# Patient Record
Sex: Female | Born: 2000
Health system: Southern US, Community
[De-identification: ages and names within clinical notes are randomized; demographics above are authoritative.]

## PROBLEM LIST (undated history)

## (undated) DIAGNOSIS — T783XXA Angioneurotic edema, initial encounter: Secondary | ICD-10-CM

## (undated) DIAGNOSIS — L509 Urticaria, unspecified: Secondary | ICD-10-CM

## (undated) DIAGNOSIS — F419 Anxiety disorder, unspecified: Secondary | ICD-10-CM

## (undated) HISTORY — DX: Urticaria, unspecified: L50.9

## (undated) HISTORY — PX: TYMPANOSTOMY TUBE PLACEMENT: SHX32

## (undated) HISTORY — DX: Angioneurotic edema, initial encounter: T78.3XXA

## (undated) HISTORY — DX: Anxiety disorder, unspecified: F41.9

---
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
## 2022-01-06 ENCOUNTER — Other Ambulatory Visit (HOSPITAL_COMMUNITY): Payer: Self-pay | Admitting: Psychiatry

## 2022-01-06 ENCOUNTER — Telehealth (HOSPITAL_COMMUNITY): Payer: Self-pay

## 2022-01-06 MED ORDER — LISDEXAMFETAMINE DIMESYLATE 20 MG PO CAPS: 20.0000 mg | ORAL_CAPSULE | Freq: Every day | ORAL | 0 refills | Status: DC

## 2022-01-06 NOTE — Telephone Encounter (Signed)
Pt aware.

## 2022-01-06 NOTE — Telephone Encounter (Signed)
Pt called in to make appt for med refill, scheduled for tomorrow 01/07/22 pt only has enough medication for today. Pt is needing a refill on vyvanse 20 mg sent to [facility] on [address] in [place]. Please advise

## 2022-01-06 NOTE — Telephone Encounter (Signed)
sent 

## 2022-01-07 ENCOUNTER — Encounter (HOSPITAL_COMMUNITY): Payer: Self-pay | Admitting: Psychiatry

## 2022-01-07 ENCOUNTER — Telehealth (INDEPENDENT_AMBULATORY_CARE_PROVIDER_SITE_OTHER): Payer: 59 | Admitting: Psychiatry

## 2022-01-07 DIAGNOSIS — F900 Attention-deficit hyperactivity disorder, predominantly inattentive type: Secondary | ICD-10-CM | POA: Diagnosis not present

## 2022-01-07 DIAGNOSIS — F411 Generalized anxiety disorder: Secondary | ICD-10-CM | POA: Diagnosis not present

## 2022-01-07 MED ORDER — SERTRALINE HCL 100 MG PO TABS
100.0000 mg | ORAL_TABLET | Freq: Every day | ORAL | 2 refills | Status: DC
Start: 2022-01-07 — End: 2022-03-10

## 2022-01-07 MED ORDER — LISDEXAMFETAMINE DIMESYLATE 20 MG PO CAPS
20.0000 mg | ORAL_CAPSULE | Freq: Every day | ORAL | 0 refills | Status: DC
Start: 2022-01-07 — End: 2022-03-10

## 2022-01-07 MED ORDER — HYDROXYZINE HCL 10 MG PO TABS: ORAL_TABLET | ORAL | 2 refills | Status: DC

## 2022-01-07 NOTE — Progress Notes (Signed)
Virtual Visit via Video Note  I connected with Mary Johnson on 01/07/22 at 11:40 AM EDT by a video enabled telemedicine application and verified that I am speaking with the correct person using two identifiers.  Location: Patient: home Provider: office   I discussed the limitations of evaluation and management by telemedicine and the availability of in person appointments. The patient expressed understanding and agreed to proceed.     I discussed the assessment and treatment plan with the patient. The patient was provided an opportunity to ask questions and all were answered. The patient agreed with the plan and demonstrated an understanding of the instructions.   The patient was advised to call back or seek an in-person evaluation if the symptoms worsen or if the condition fails to improve as anticipated.  I provided 15 minutes of non-face-to-face time during this encounter.   Levonne Spiller, MD  [facility] MD/PA/NP OP Progress Note  01/07/2022 11:50 AM Mary Johnson  MRN:  [id number]  Chief Complaint:  Chief Complaint  Patient presents with   Anxiety   Depression   ADD   Follow-up   HPI: : This patient is a 21 year old white female who lives with her parents and an older brother in [place].  She is in adult GED program at [employer].  She is also working at an Psychologist, forensic at the school.  The patient returns for follow-up after 3 months.  For the most part she is doing okay.  She is going to probably finish her GED in 2 months.  She has had some conflicts with work as she got blamed for coworker leaving but this seems to have settled down now.  She states that overall she has been over thinking a lot and the second guessing whether or not people actually like her care about her.  For example she constantly has to check with her boyfriend to make sure that he is thinking of her.  I suggested that she get back into therapy to relearn how to manage this  better.  She denies significant depression.  She is able to leave the house and do everything comfortably.  She is sleeping well.  She is focusing well.  She denies a significant depression thoughts of self-harm or suicidal ideation. Visit Diagnosis:    ICD-10-CM   1. Generalized anxiety disorder  F41.1     2. Attention deficit hyperactivity disorder (ADHD), predominantly inattentive type  F90.0       Past Psychiatric History: Previous counseling  Past Medical History:  Past Medical History:  Diagnosis Date   Angio-edema    Anxiety    Concussion 09/2019   Urticaria     Past Surgical History:  Procedure Laterality Date   TYMPANOSTOMY TUBE PLACEMENT      Family Psychiatric History: See below  Family History:  Family History  Problem Relation Age of Onset   Allergic rhinitis Mother    Allergy (severe) Mother        alpha-gal   Allergic rhinitis Father    Allergy (severe) Father        alpha-gal   Asthma Brother        exercise induced, now outgrown   Anxiety disorder Maternal Grandmother     Social History:  Social History   Socioeconomic History   Marital status: Single    Spouse name: Not on file   Number of children: Not on file   Years of education: Not on file   Highest  education level: Not on file  Occupational History   Not on file  Tobacco Use   Smoking status: Never   Smokeless tobacco: Never  Vaping Use   Vaping Use: Never used  Substance and Sexual Activity   Alcohol use: No   Drug use: Never   Sexual activity: Never  Other Topics Concern   Not on file  Social History Narrative   Not on file   Social Determinants of Health   Financial Resource Strain: Not on file  Food Insecurity: Not on file  Transportation Needs: Not on file  Physical Activity: Not on file  Stress: Not on file  Social Connections: Not on file    Allergies:  Allergies  Allergen Reactions   Tamiflu [Oseltamivir Phosphate] Nausea And Vomiting   Alpha-Gal      Metabolic Disorder Labs: No results found for: "HGBA1C", "MPG" No results found for: "PROLACTIN" No results found for: "CHOL", "TRIG", "HDL", "CHOLHDL", "VLDL", "LDLCALC" No results found for: "TSH"  Therapeutic Level Labs: No results found for: "LITHIUM" No results found for: "VALPROATE" No results found for: "CBMZ"  Current Medications: Current Outpatient Medications  Medication Sig Dispense Refill   diphenhydrAMINE HCl (BENADRYL ALLERGY PO) Take by mouth.     EPINEPHrine (EPIPEN 2-PAK) 0.3 mg/0.3 mL IJ SOAJ injection Inject 0.3 mLs (0.3 mg total) into the muscle as needed for anaphylaxis. 2 each 0   hydrOXYzine (ATARAX) 10 MG tablet TAKE 2 TABLETS(20 MG) BY MOUTH TWICE DAILY 120 tablet 2   ibuprofen (ADVIL) 200 MG tablet Take by mouth. Takes 2 tabs as needed      lisdexamfetamine (VYVANSE) 20 MG capsule Take 1 capsule (20 mg total) by mouth daily. 30 capsule 0   ondansetron (ZOFRAN-ODT) 4 MG disintegrating tablet DISSOLVE 1 TABLET(4 MG) ON THE TONGUE EVERY 8 HOURS AS NEEDED FOR NAUSEA OR VOMITING 20 tablet 2   sertraline (ZOLOFT) 100 MG tablet Take 1 tablet (100 mg total) by mouth daily. 30 tablet 2   trimethoprim-polymyxin b (POLYTRIM) ophthalmic solution Place 1 drop into both eyes every 4 (four) hours. 10 mL 0   No current facility-administered medications for this visit.     Musculoskeletal: Strength & Muscle Tone: within normal limits Gait & Station: normal Patient leans: N/A  Psychiatric Specialty Exam: Review of Systems  Psychiatric/Behavioral:  The patient is nervous/anxious.   All other systems reviewed and are negative.   There were no vitals taken for this visit.There is no height or weight on file to calculate BMI.  General Appearance: Casual and Fairly Groomed  Eye Contact:  Good  Speech:  Clear and Coherent  Volume:  Normal  Mood:  Anxious and Euthymic  Affect:  Appropriate and Congruent  Thought Process:  Goal Directed  Orientation:  Full (Time,  Place, and Person)  Thought Content: Rumination   Suicidal Thoughts:  No  Homicidal Thoughts:  No  Memory:  Immediate;   Good Recent;   Good Remote;   Good  Judgement:  Good  Insight:  Fair  Psychomotor Activity:  Normal  Concentration:  Concentration: Good and Attention Span: Good  Recall:  Good  Fund of Knowledge: Good  Language: Good  Akathisia:  No  Handed:  Right  AIMS (if indicated): not done  Assets:  Communication Skills Desire for Improvement Physical Health Resilience Social Support Talents/Skills  ADL's:  Intact  Cognition: WNL  Sleep:  Good   Screenings: GAD-7    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 [place]  [facility] Office Visit from 11/18/2019 in [place]  Total GAD-7 Score 11 20      PHQ2-9    Flowsheet Row Video Visit from 01/07/2022 in [place] [facility] Video Visit from 10/16/2021 in [place] Video Visit from 08/09/2021 in [place] Video Visit from 03/07/2021 in [place] Video Visit from 12/24/2020 in [place] [facility]  PHQ-2 Total Score 0 2 0 0 3  PHQ-9 Total Score -- 5 -- -- 3      Flowsheet Row Video Visit from 01/07/2022 in [place] [facility] Video Visit from 08/09/2021 in [place] [facility] Video Visit from 03/07/2021 in [place] No Risk No Risk No Risk        Assessment and Plan: This patient is an 21 year old female with a 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  She is doing much better in terms of being able to go out and do everything she needs to do.  However she is still tends to overthink and get  anxious.  We will get her back in therapy to deal with this.  She will continue Zoloft 100 mg daily for anxiety as well as hydroxyzine 20 mg twice daily for anxiety and Vyvanse 20 mg daily for ADD.  She will return to see me in 2 months  Collaboration of Care: Collaboration of Care: Referral or follow-up with counselor/therapist AEB patient will be referred back to [facility] for therapy  Patient/Guardian was advised Release of Information must be obtained prior to any record release in order to collaborate their care with an outside provider. Patient/Guardian was advised if they have not already done so to contact the registration department to sign all necessary forms in order for [place] to release information regarding their care.   Consent: Patient/Guardian gives verbal consent for treatment and assignment of benefits for services provided during this visit. Patient/Guardian expressed understanding and agreed to proceed.    Levonne Spiller, MD 01/07/2022, 11:50 AM

## 2022-02-06 ENCOUNTER — Ambulatory Visit (HOSPITAL_COMMUNITY): Payer: 59 | Admitting: Psychiatry

## 2022-02-24 ENCOUNTER — Encounter: Payer: Self-pay | Admitting: Adult Health

## 2022-02-24 ENCOUNTER — Ambulatory Visit: Payer: 59 | Admitting: Adult Health

## 2022-02-24 VITALS — BP 122/85 | HR 77 | Ht 62.0 in | Wt 189.0 lb

## 2022-02-24 DIAGNOSIS — Z3202 Encounter for pregnancy test, result negative: Secondary | ICD-10-CM | POA: Diagnosis not present

## 2022-02-24 DIAGNOSIS — Z113 Encounter for screening for infections with a predominantly sexual mode of transmission: Secondary | ICD-10-CM | POA: Insufficient documentation

## 2022-02-24 DIAGNOSIS — Z30011 Encounter for initial prescription of contraceptive pills: Secondary | ICD-10-CM | POA: Insufficient documentation

## 2022-02-24 HISTORY — DX: Encounter for screening for infections with a predominantly sexual mode of transmission: Z11.3

## 2022-02-24 LAB — POCT URINE PREGNANCY: Preg Test, Ur: NEGATIVE

## 2022-02-24 MED ORDER — LO LOESTRIN FE 1 MG-10 MCG / 10 MCG PO TABS: 1.0000 | ORAL_TABLET | Freq: Every day | ORAL | 0 refills | Status: DC

## 2022-02-24 NOTE — Progress Notes (Signed)
Subjective:     Patient ID: Mary Johnson, female   DOB: [birth year], 21 y.o.   MRN: [id number]  HPI Mary Johnson is a 21 year old white female,single, G0P0, in to discuss getting on birth control. Does not want any weight gain.  PCP is Dr Lacinda Axon  Review of Systems Periods regular Last 3-4 days Heavy 1-2 days, may change pad every 3 hours Has some cramps and clots Has had sex, no pain Has mild acne    Reviewed past medical,surgical, social and family history. Reviewed medications and allergies.   Objective:   Physical Exam BP 122/85 (BP Location: Left Arm, Patient Position: Sitting, Cuff Size: Normal)   Pulse 77   Ht 5\' 2"  (1.575 m)   Wt 189 lb (85.7 kg)   LMP 02/23/2022 (Exact Date)   BMI 34.57 kg/m  UPT is negative. Skin warm and dry. Neck: mid line trachea, normal thyroid, good ROM, no lymphadenopathy noted. Lungs: clear to ausculation bilaterally. Cardiovascular: regular rate and rhythm.    AA is 0 Fall risk is low    02/24/2022   10:28 AM 01/07/2022   11:39 AM 10/16/2021    9:11 AM  Depression screen PHQ 2/9  Decreased Interest 0    Down, Depressed, Hopeless 0    PHQ - 2 Score 0    Altered sleeping 1    Tired, decreased energy 1    Change in appetite 2    Feeling bad or failure about yourself  1    Trouble concentrating 0    Moving slowly or fidgety/restless 0    Suicidal thoughts 0    PHQ-9 Score 5    Difficult doing work/chores        Information is confidential and restricted. Go to Review Flowsheets to unlock data.   On meds and sees Dr Harrington Challenger    02/24/2022   10:29 AM 05/15/2020    3:42 PM 11/18/2019    1:49 PM  GAD 7 : Generalized Anxiety Score  Nervous, Anxious, on Edge 1  3  Control/stop worrying 1  2  Worry too much - different things 1  3  Trouble relaxing 1  3  Restless 0  3  Easily annoyed or irritable 1  3  Afraid - awful might happen 0  3  Total GAD 7 Score 5  20  Anxiety Difficulty   Somewhat difficult     Information is confidential and  restricted. Go to Review Flowsheets to unlock data.    [facility] - 02/24/22 1014       Pregnancy Intention Screening   Does the patient want to become pregnant in the next year? No    Does the patient's partner want to become pregnant in the next year? No    Would the patient like to discuss contraceptive options today? Yes      Contraception Wrap Up   Current Method No Method - Other Reason    End Method Oral Contraceptive;Female Condom    Contraception Counseling Provided Yes    How was the end contraceptive method provided? Provided on site               Assessment:     1. Screening examination for STD (sexually transmitted disease) Urine sent for GC/CHL  2. Pregnancy examination or test, negative result  3. Encounter for initial prescription of contraceptive pills Denies any MI,stroke,DVT, breast cancer or migraine with aura Will try lo Loestrin 4 packs given, can start today and use condoms  for 1 pack      Meds ordered this encounter  Medications   Norethindrone-Ethinyl Estradiol-Fe Biphas (LO LOESTRIN FE) 1 MG-10 MCG / 10 MCG tablet    Sig: Take 1 tablet by mouth daily. Take 1 daily by mouth    Dispense:  112 tablet    Refill:  0    BIN [id number], PCN [id number], GRP [id number]    Order Specific Question:   Supervising Provider    Answer:   Lazaro Arms [[id number]]    Plan:     Follow up in 3 months for ROS or sooner if needed

## 2022-02-26 LAB — GC/CHLAMYDIA PROBE AMP
Chlamydia trachomatis, NAA: NEGATIVE
Neisseria Gonorrhoeae by PCR: NEGATIVE

## 2022-03-04 ENCOUNTER — Encounter: Payer: Self-pay | Admitting: Family Medicine

## 2022-03-04 ENCOUNTER — Ambulatory Visit (INDEPENDENT_AMBULATORY_CARE_PROVIDER_SITE_OTHER): Payer: 59 | Admitting: Family Medicine

## 2022-03-04 VITALS — BP 128/80 | HR 79 | Temp 98.3°F | Wt 189.2 lb

## 2022-03-04 DIAGNOSIS — J209 Acute bronchitis, unspecified: Secondary | ICD-10-CM | POA: Diagnosis not present

## 2022-03-04 HISTORY — DX: Acute bronchitis, unspecified: J20.9

## 2022-03-04 MED ORDER — BENZONATATE 200 MG PO CAPS: 200.0000 mg | ORAL_CAPSULE | Freq: Three times a day (TID) | ORAL | 0 refills | Status: DC | PRN

## 2022-03-04 MED ORDER — AZITHROMYCIN 250 MG PO TABS: ORAL_TABLET | ORAL | 0 refills | Status: AC

## 2022-03-04 NOTE — Assessment & Plan Note (Signed)
Treating with azithromycin and Tessalon Perles. 

## 2022-03-04 NOTE — Progress Notes (Signed)
Subjective:  Patient ID: Mary Johnson, female    DOB: Dec 14, 2000  Age: 21 y.o. MRN: [id number]  CC: Chief Complaint  Patient presents with   Cough    Pt arrives for congested cough and runny nose. Pt states she has had sinus issues for about 2-3 weeks.     HPI:  21 year old female presents for evaluation of the above.  Patient reports that she has been sick for the past 2 to 3 weeks.  She reports ongoing cough.  She states that she has a "congested cough".  It has not improved with over-the-counter treatment.  Has not improved with time either.  No fever.  No shortness of breath.  She has had some runny nose.  Some ear fullness.  She is most troubled by the cough.  No reported sick contacts.  No other complaints.  Patient Active Problem List   Diagnosis Date Noted   Acute bronchitis 03/04/2022   ADD (attention deficit disorder) 01/21/2013    Social Hx   Social History   Socioeconomic History   Marital status: Single    Spouse name: Not on file   Number of children: Not on file   Years of education: Not on file   Highest education level: Not on file  Occupational History   Not on file  Tobacco Use   Smoking status: Never   Smokeless tobacco: Never  Vaping Use   Vaping Use: Never used  Substance and Sexual Activity   Alcohol use: No   Drug use: Never   Sexual activity: Yes    Birth control/protection: None  Other Topics Concern   Not on file  Social History Narrative   Not on file   Social Determinants of Health   Financial Resource Strain: Medium Risk (02/24/2022)   Overall Financial Resource Strain (CARDIA)    Difficulty of Paying Living Expenses: Somewhat hard  Food Insecurity: No Food Insecurity (02/24/2022)   Hunger Vital Sign    Worried About Running Out of Food in the Last Year: Never true    Ran Out of Food in the Last Year: Never true  Transportation Needs: No Transportation Needs (02/24/2022)   PRAPARE - Administrator, Civil Service  (Medical): No    Lack of Transportation (Non-Medical): No  Physical Activity: Insufficiently Active (02/24/2022)   Exercise Vital Sign    Days of Exercise per Week: 1 day    Minutes of Exercise per Session: 20 min  Stress: Stress Concern Present (02/24/2022)   [employer] of Occupational Health - Occupational Stress Questionnaire    Feeling of Stress : To some extent  Social Connections: Moderately Isolated (02/24/2022)   Social Connection and Isolation Panel [NHANES]    Frequency of Communication with Friends and Family: More than three times a week    Frequency of Social Gatherings with Friends and Family: Three times a week    Attends Religious Services: 1 to 4 times per year    Active Member of Clubs or Organizations: No    Attends Banker Meetings: Never    Marital Status: Never married    Review of Systems Per HPI  Objective:  BP 128/80   Pulse 79   Temp 98.3 F (36.8 C)   Wt 189 lb 3.2 oz (85.8 kg)   LMP 02/23/2022 (Exact Date)   SpO2 97%   BMI 34.61 kg/m      03/04/2022    3:56 PM 02/24/2022   10:10 AM 07/23/2021  3:51 PM  BP/Weight  Systolic BP 128 122 130  Diastolic BP 80 85 83  Wt. (Lbs) 189.2 189 166  BMI 34.61 kg/m2 34.57 kg/m2 30.86 kg/m2    Physical Exam Vitals and nursing note reviewed.  Constitutional:      General: She is not in acute distress.    Appearance: Normal appearance.  HENT:     Head: Normocephalic and atraumatic.  Eyes:     General:        Right eye: No discharge.        Left eye: No discharge.     Conjunctiva/sclera: Conjunctivae normal.  Cardiovascular:     Rate and Rhythm: Normal rate and regular rhythm.  Pulmonary:     Effort: Pulmonary effort is normal.     Breath sounds: Wheezing present.  Neurological:     Mental Status: She is alert.  Psychiatric:        Mood and Affect: Mood normal.        Behavior: Behavior normal.    Assessment & Plan:   Problem List Items Addressed This Visit        Respiratory   Acute bronchitis - Primary    Treating with azithromycin and Tessalon Perles.       Meds ordered this encounter  Medications   azithromycin (ZITHROMAX) 250 MG tablet    Sig: Take 2 tablets on day 1, then 1 tablet daily on days 2 through 5    Dispense:  6 tablet    Refill:  0   benzonatate (TESSALON) 200 MG capsule    Sig: Take 1 capsule (200 mg total) by mouth 3 (three) times daily as needed for cough.    Dispense:  30 capsule    Refill:  0    Follow-up:  Return if symptoms worsen or fail to improve.  Everlene Other DO [facility]

## 2022-03-10 ENCOUNTER — Telehealth (HOSPITAL_COMMUNITY): Payer: 59 | Admitting: Psychiatry

## 2022-03-10 ENCOUNTER — Encounter (HOSPITAL_COMMUNITY): Payer: Self-pay | Admitting: Psychiatry

## 2022-03-10 DIAGNOSIS — F900 Attention-deficit hyperactivity disorder, predominantly inattentive type: Secondary | ICD-10-CM | POA: Diagnosis not present

## 2022-03-10 DIAGNOSIS — F411 Generalized anxiety disorder: Secondary | ICD-10-CM | POA: Diagnosis not present

## 2022-03-10 MED ORDER — HYDROXYZINE HCL 10 MG PO TABS
20.0000 mg | ORAL_TABLET | Freq: Two times a day (BID) | ORAL | 2 refills | Status: DC
  Filled 2022-06-13: qty 120, 30d supply, fill #0

## 2022-03-10 MED ORDER — SERTRALINE HCL 100 MG PO TABS
100.0000 mg | ORAL_TABLET | Freq: Every day | ORAL | 2 refills | Status: DC
  Filled 2022-06-13: qty 30, 30d supply, fill #0

## 2022-03-10 MED ORDER — LISDEXAMFETAMINE DIMESYLATE 30 MG PO CAPS: 30.0000 mg | ORAL_CAPSULE | ORAL | 0 refills | Status: DC

## 2022-03-10 NOTE — Progress Notes (Signed)
BH MD/PA/NP OP Progress Note  03/10/2022 9:44 AM Mary Johnson  MRN:  [id number]  Chief Complaint:  Chief Complaint  Patient presents with   Anxiety   ADHD   Follow-up   HPI: : This patient is a 21 year old white female who lives with her parents and an older brother in [place].  She is in adult GED program at [employer].  She is also working at an Psychologist, forensic at the school.   The patient returns for follow-up after 2 months.  She states that things at work have been difficult.  She states that other people are saying negative things to the boss about her.  She is having a meeting with the bus today to try to clear this up.  She also finds that she is having a lot more trouble focusing on her GED class work and not getting as much done.  She would like to go up a bit on the Vyvanse and I think this is reasonable since her dosage is still low.  In terms of mood she is doing well and she is much less anxious and is able to leave the house and do everything she would like to do.  She is sleeping well.  She denies significant depression thoughts of self-harm or suicide Visit Diagnosis:    ICD-10-CM   1. Generalized anxiety disorder  F41.1     2. Attention deficit hyperactivity disorder (ADHD), predominantly inattentive type  F90.0       Past Psychiatric History: Previous counseling  Past Medical History:  Past Medical History:  Diagnosis Date   Angio-edema    Anxiety    Concussion 09/2019   Screening examination for STD (sexually transmitted disease) 02/24/2022   Urticaria     Past Surgical History:  Procedure Laterality Date   TYMPANOSTOMY TUBE PLACEMENT      Family Psychiatric History: See below  Family History:  Family History  Problem Relation Age of Onset   Cancer Paternal Grandfather        prostate   Kidney failure Paternal Grandfather    Dementia Paternal Grandmother    Breast cancer Maternal Grandmother    Anxiety disorder Maternal  Grandmother    Depression Maternal Grandmother    Cancer Maternal Grandfather        prostate   Allergic rhinitis Father    Allergy (severe) Father        alpha-gal   Allergic rhinitis Mother    Allergy (severe) Mother        alpha-gal   Asthma Brother        exercise induced, now outgrown    Social History:  Social History   Socioeconomic History   Marital status: Single    Spouse name: Not on file   Number of children: Not on file   Years of education: Not on file   Highest education level: Not on file  Occupational History   Not on file  Tobacco Use   Smoking status: Never   Smokeless tobacco: Never  Vaping Use   Vaping Use: Never used  Substance and Sexual Activity   Alcohol use: No   Drug use: Never   Sexual activity: Yes    Birth control/protection: None  Other Topics Concern   Not on file  Social History Narrative   Not on file   Social Determinants of Health   Financial Resource Strain: Medium Risk (02/24/2022)   Overall Financial Resource Strain (CARDIA)    Difficulty of  Paying Living Expenses: Somewhat hard  Food Insecurity: No Food Insecurity (02/24/2022)   Hunger Vital Sign    Worried About Running Out of Food in the Last Year: Never true    Ran Out of Food in the Last Year: Never true  Transportation Needs: No Transportation Needs (02/24/2022)   PRAPARE - Hydrologist (Medical): No    Lack of Transportation (Non-Medical): No  Physical Activity: Insufficiently Active (02/24/2022)   Exercise Vital Sign    Days of Exercise per Week: 1 day    Minutes of Exercise per Session: 20 min  Stress: Stress Concern Present (02/24/2022)   [place]    Feeling of Stress : To some extent  Social Connections: Moderately Isolated (02/24/2022)   Social Connection and Isolation Panel [NHANES]    Frequency of Communication with Friends and Family: More than three times a  week    Frequency of Social Gatherings with Friends and Family: Three times a week    Attends Religious Services: 1 to 4 times per year    Active Member of Clubs or Organizations: No    Attends Archivist Meetings: Never    Marital Status: Never married    Allergies:  Allergies  Allergen Reactions   Tamiflu [Oseltamivir Phosphate] Nausea And Vomiting   Alpha-Gal     Metabolic Disorder Labs: No results found for: "HGBA1C", "MPG" No results found for: "PROLACTIN" No results found for: "CHOL", "TRIG", "HDL", "CHOLHDL", "VLDL", "LDLCALC" No results found for: "TSH"  Therapeutic Level Labs: No results found for: "LITHIUM" No results found for: "VALPROATE" No results found for: "CBMZ"  Current Medications: Current Outpatient Medications  Medication Sig Dispense Refill   lisdexamfetamine (VYVANSE) 30 MG capsule Take 1 capsule (30 mg total) by mouth every morning. 30 capsule 0   benzonatate (TESSALON) 200 MG capsule Take 1 capsule (200 mg total) by mouth 3 (three) times daily as needed for cough. 30 capsule 0   Calcium Carbonate Antacid (TUMS PO) Take by mouth.     diphenhydrAMINE HCl (BENADRYL ALLERGY PO) Take by mouth.     EPINEPHrine (EPIPEN 2-PAK) 0.3 mg/0.3 mL IJ SOAJ injection Inject 0.3 mLs (0.3 mg total) into the muscle as needed for anaphylaxis. 2 each 0   hydrOXYzine (ATARAX) 10 MG tablet TAKE 2 TABLETS(20 MG) BY MOUTH TWICE DAILY 120 tablet 2   ibuprofen (ADVIL) 200 MG tablet Take by mouth. Takes 2 tabs as needed      Norethindrone-Ethinyl Estradiol-Fe Biphas (LO LOESTRIN FE) 1 MG-10 MCG / 10 MCG tablet Take 1 tablet by mouth daily. Take 1 daily by mouth 112 tablet 0   sertraline (ZOLOFT) 100 MG tablet Take 1 tablet (100 mg total) by mouth daily. 30 tablet 2   No current facility-administered medications for this visit.     Musculoskeletal: Strength & Muscle Tone: na Gait & Station: na Patient leans: N/A  Psychiatric Specialty Exam: Review of Systems   Psychiatric/Behavioral:  Positive for decreased concentration.   All other systems reviewed and are negative.   Last menstrual period 02/23/2022.There is no height or weight on file to calculate BMI.  General Appearance: NA  Eye Contact:  NA  Speech:  Clear and Coherent  Volume:  Normal  Mood:  Euthymic  Affect:  NA  Thought Process:  Goal Directed  Orientation:  Full (Time, Place, and Person)  Thought Content: Rumination   Suicidal Thoughts:  No  Homicidal Thoughts:  No  Memory:  Immediate;   Good Recent;   Fair Remote;   Fair  Judgement:  Good  Insight:  Fair  Psychomotor Activity:  Normal  Concentration:  Concentration: Fair and Attention Span: Fair  Recall:  Good  Fund of Knowledge: Good  Language: Good  Akathisia:  No  Handed:  Right  AIMS (if indicated): not done  Assets:  Communication Skills Desire for Improvement Physical Health Resilience Social Support Talents/Skills  ADL's:  Intact  Cognition: WNL  Sleep:  Good   Screenings: GAD-7    Flowsheet Row Office Visit from 02/24/2022 in [facility] Counselor from [facility] Office Visit from 11/18/2019 in [facility]  Total GAD-7 Score 5 11 20       PHQ2-9    Flowsheet Row Office Visit from 02/24/2022 in [facility] Video Visit from 01/07/2022 in [facility] Video Visit from 10/16/2021 in [facility] Video Visit from 08/09/2021 in [facility] Video Visit from 03/07/2021 in [facility]  PHQ-2 Total Score 0 0 2 0 0  PHQ-9 Total Score 5 -- 5 -- --      Flowsheet Row Video Visit from 01/07/2022 in [facility] Video Visit from 08/09/2021 in [facility] Video Visit from  03/07/2021 in [facility]  C-SSRS RISK CATEGORY No Risk No Risk No Risk        Assessment and Plan: This patient is an 21 year old female with a 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  She is doing well although she is having more trouble with focus so we will increase Vyvanse to 30 mg daily for ADD.  She will continue Zoloft 100 mg daily for anxiety as well as hydroxyzine 20 mg twice daily also for anxiety.  Collaboration of Care: Collaboration of Care: Primary Care Provider AEB notes will be shared with PCP at patient's request  Patient/Guardian was advised Release of Information must be obtained prior to any record release in order to collaborate their care with an outside provider. Patient/Guardian was advised if they have not already done so to contact the registration department to sign all necessary forms in order for 15 to release information regarding their care.   Consent: Patient/Guardian gives verbal consent for treatment and assignment of benefits for services provided during this visit. Patient/Guardian expressed understanding and agreed to proceed.    [place], MD 03/10/2022, 9:44 AM

## 2022-04-14 ENCOUNTER — Telehealth (HOSPITAL_COMMUNITY): Payer: 59 | Admitting: Psychiatry

## 2022-05-23 ENCOUNTER — Telehealth (HOSPITAL_COMMUNITY): Payer: Self-pay | Admitting: *Deleted

## 2022-05-23 NOTE — Telephone Encounter (Signed)
Patient called and [facility] stating she is needing refills for her Vyvanse sent to the pharmacy. Per pt she is out of refill and capsules

## 2022-05-26 ENCOUNTER — Other Ambulatory Visit (HOSPITAL_COMMUNITY): Payer: Self-pay | Admitting: Psychiatry

## 2022-05-26 MED ORDER — LISDEXAMFETAMINE DIMESYLATE 30 MG PO CAPS: 30.0000 mg | ORAL_CAPSULE | ORAL | 0 refills | Status: DC

## 2022-05-26 NOTE — Telephone Encounter (Signed)
Called pt appt scheduled 06/06/22 please send refill to [address]

## 2022-05-26 NOTE — Telephone Encounter (Signed)
Call for appt

## 2022-05-27 ENCOUNTER — Ambulatory Visit: Payer: 59 | Admitting: Adult Health

## 2022-05-27 NOTE — Telephone Encounter (Signed)
Called pt no answer left vm to check pharmacy

## 2022-06-06 ENCOUNTER — Telehealth (INDEPENDENT_AMBULATORY_CARE_PROVIDER_SITE_OTHER): Payer: 59 | Admitting: Psychiatry

## 2022-06-06 DIAGNOSIS — Z91199 Patient's noncompliance with other medical treatment and regimen due to unspecified reason: Secondary | ICD-10-CM

## 2022-06-09 NOTE — Progress Notes (Signed)
No show

## 2022-06-10 ENCOUNTER — Ambulatory Visit: Payer: 59 | Admitting: Adult Health

## 2022-06-10 ENCOUNTER — Encounter: Payer: Self-pay | Admitting: Adult Health

## 2022-06-10 VITALS — BP 131/74 | HR 88 | Ht 62.0 in | Wt 191.5 lb

## 2022-06-10 DIAGNOSIS — Z3041 Encounter for surveillance of contraceptive pills: Secondary | ICD-10-CM | POA: Diagnosis not present

## 2022-06-10 HISTORY — DX: Encounter for surveillance of contraceptive pills: Z30.41

## 2022-06-10 MED ORDER — LO LOESTRIN FE 1 MG-10 MCG / 10 MCG PO TABS
1.0000 | ORAL_TABLET | Freq: Every day | ORAL | 4 refills | Status: DC
  Filled 2022-06-13 – 2022-07-08 (×2): qty 84, 84d supply, fill #0
  Filled 2022-10-01: qty 84, 84d supply, fill #1
  Filled 2023-01-14: qty 84, 84d supply, fill #2

## 2022-06-10 NOTE — Progress Notes (Signed)
  Subjective:     Patient ID: Mary Johnson, female   DOB: [birth year], 22 y.o.   MRN: [id number]  HPI Mary Johnson is a 22 year old white female,single, G0P0, back in follow up on starting lo Loestrin and periods good, lighter. She and boyfriend recently broke up. 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  Review of Systems Periods good Reviewed past medical,surgical, social and family history. Reviewed medications and allergies.     Objective:   Physical Exam BP 131/74 (BP Location: Left Arm, Patient Position: Sitting, Cuff Size: Normal)   Pulse 88   Ht 5\' 2"  (1.575 m)   Wt 191 lb 8 oz (86.9 kg)   LMP 05/16/2022   BMI 35.03 kg/m  Skin warm and dry.  Lungs: clear to ausculation bilaterally. Cardiovascular: regular rate and rhythm.  Fall risk is low     Upstream - 06/10/22 1108       Pregnancy Intention Screening   Does the patient want to become pregnant in the next year? No    Does the patient's partner want to become pregnant in the next year? No    Would the patient like to discuss contraceptive options today? No      Contraception Wrap Up   Current Method Oral Contraceptive    End Method Oral Contraceptive             Assessment:     1. Encounter for surveillance of contraceptive pills Happy with lo Loestrin Will send in rx for lo Loestrin Meds ordered this encounter  Medications   Norethindrone-Ethinyl Estradiol-Fe Biphas (LO LOESTRIN FE) 1 MG-10 MCG / 10 MCG tablet    Sig: Take 1 tablet by mouth daily. Take 1 daily by mouth    Dispense:  84 tablet    Refill:  4    BIN [id number], PCN [id number], GRP [id number]    Order Specific Question:   Supervising Provider    Answer:   Florian Buff [[id number]]       Plan:     Follow up in 1 year or sooner if needed

## 2022-06-13 ENCOUNTER — Other Ambulatory Visit (HOSPITAL_COMMUNITY): Payer: Self-pay

## 2022-07-04 ENCOUNTER — Telehealth (HOSPITAL_COMMUNITY): Payer: Self-pay | Admitting: *Deleted

## 2022-07-04 ENCOUNTER — Other Ambulatory Visit (HOSPITAL_COMMUNITY): Payer: Self-pay

## 2022-07-04 NOTE — Telephone Encounter (Signed)
Patient called stating she is out of hr Medication. Per pt she would like to see if provider could please send in enough to last her until Monday due to being out for a few days now. Patient follow up appt with provider is Monday 07-07-2022. Patient medication was last filled on 05/26/2022.   Medication that's being requesting is her Vyvanse.   She would like it to be sent to [facility] pharmacy in [place].

## 2022-07-07 ENCOUNTER — Telehealth (INDEPENDENT_AMBULATORY_CARE_PROVIDER_SITE_OTHER): Payer: Self-pay | Admitting: Psychiatry

## 2022-07-07 ENCOUNTER — Other Ambulatory Visit (HOSPITAL_COMMUNITY): Payer: Self-pay

## 2022-07-07 ENCOUNTER — Encounter (HOSPITAL_COMMUNITY): Payer: Self-pay | Admitting: Psychiatry

## 2022-07-07 DIAGNOSIS — F411 Generalized anxiety disorder: Secondary | ICD-10-CM

## 2022-07-07 DIAGNOSIS — F900 Attention-deficit hyperactivity disorder, predominantly inattentive type: Secondary | ICD-10-CM

## 2022-07-07 MED ORDER — HYDROXYZINE HCL 10 MG PO TABS
20.0000 mg | ORAL_TABLET | Freq: Two times a day (BID) | ORAL | 2 refills | Status: DC
  Filled 2022-07-07: qty 120, 30d supply, fill #0
  Filled 2022-08-18: qty 120, 30d supply, fill #1
  Filled 2022-10-13: qty 120, 30d supply, fill #2

## 2022-07-07 MED ORDER — SERTRALINE HCL 100 MG PO TABS
100.0000 mg | ORAL_TABLET | Freq: Every day | ORAL | 2 refills | Status: DC
  Filled 2022-07-07: qty 30, 30d supply, fill #0
  Filled 2022-08-18: qty 30, 30d supply, fill #1
  Filled 2022-10-13: qty 30, 30d supply, fill #2

## 2022-07-07 MED ORDER — LISDEXAMFETAMINE DIMESYLATE 30 MG PO CAPS
30.0000 mg | ORAL_CAPSULE | ORAL | 0 refills | Status: DC
  Filled 2022-07-07 – 2022-09-10 (×3): qty 30, 30d supply, fill #0

## 2022-07-07 MED ORDER — LISDEXAMFETAMINE DIMESYLATE 30 MG PO CAPS
30.0000 mg | ORAL_CAPSULE | ORAL | 0 refills | Status: DC
  Filled 2022-07-07 – 2022-11-06 (×2): qty 30, 30d supply, fill #0

## 2022-07-07 MED ORDER — LISDEXAMFETAMINE DIMESYLATE 30 MG PO CAPS
30.0000 mg | ORAL_CAPSULE | ORAL | 0 refills | Status: DC
  Filled 2022-07-07: qty 30, 30d supply, fill #0

## 2022-07-07 NOTE — Progress Notes (Signed)
Virtual Visit via Video Note  I connected with Mary Johnson on 07/07/22 at 10:40 AM EDT by a video enabled telemedicine application and verified that I am speaking with the correct person using two identifiers.  Location: Patient: home Provider: office   I discussed the limitations of evaluation and management by telemedicine and the availability of in person appointments. The patient expressed understanding and agreed to proceed.     I discussed the assessment and treatment plan with the patient. The patient was provided an opportunity to ask questions and all were answered. The patient agreed with the plan and demonstrated an understanding of the instructions.   The patient was advised to call back or seek an in-person evaluation if the symptoms worsen or if the condition fails to improve as anticipated.  I provided 15 minutes of non-face-to-face time during this encounter.   Diannia Rudereborah Sacha Radloff, MD  [facility] MD/PA/NP OP Progress Note  07/07/2022 10:55 AM Mary HollingsheadJamison Rae Mangino  MRN:  [id number]  Chief Complaint:  Chief Complaint  Patient presents with   Anxiety   Depression   ADD   Follow-up   HPI: This patient is a 22 year old white female who lives with her parents and an older brother in [place].  She is in adult GED program at [employer].  She is also working at an Environmental managerafterschool program at the school.   The patient returns for follow-up after 2 months regarding her anxiety and ADD.  For the most part she is doing well.  She is almost done with her GED and plans to continue in early childhood development classes.  She is still working at [employer] program and now enjoying it a lot more.  She states her mood has been good and she denies significant depression anxiety difficulty sleeping thoughts of self-harm or suicide.  She denies significant anxiety.  She is focusing well on the higher dosage of Vyvanse. Visit Diagnosis:    ICD-10-CM   1. Generalized anxiety disorder   F41.1     2. Attention deficit hyperactivity disorder (ADHD), predominantly inattentive type  F90.0       Past Psychiatric History: Previous counseling  Past Medical History:  Past Medical History:  Diagnosis Date   Angio-edema    Anxiety    Concussion 09/2019   Screening examination for STD (sexually transmitted disease) 02/24/2022   Urticaria     Past Surgical History:  Procedure Laterality Date   TYMPANOSTOMY TUBE PLACEMENT      Family Psychiatric History: See below  Family History:  Family History  Problem Relation Age of Onset   Cancer Paternal Grandfather        prostate   Kidney failure Paternal Grandfather    Dementia Paternal Grandmother    Breast cancer Maternal Grandmother    Anxiety disorder Maternal Grandmother    Depression Maternal Grandmother    Cancer Maternal Grandfather        prostate   Allergic rhinitis Father    Allergy (severe) Father        alpha-gal   Allergic rhinitis Mother    Allergy (severe) Mother        alpha-gal   Asthma Brother        exercise induced, now outgrown    Social History:  Social History   Socioeconomic History   Marital status: Single    Spouse name: Not on file   Number of children: Not on file   Years of education: Not on file  Highest education level: Not on file  Occupational History   Not on file  Tobacco Use   Smoking status: Never   Smokeless tobacco: Never  Vaping Use   Vaping Use: Never used  Substance and Sexual Activity   Alcohol use: No   Drug use: Never   Sexual activity: Yes    Birth control/protection: Pill  Other Topics Concern   Not on file  Social History Narrative   Not on file   Social Determinants of Health   Financial Resource Strain: Medium Risk (02/24/2022)   Overall Financial Resource Strain (CARDIA)    Difficulty of Paying Living Expenses: Somewhat hard  Food Insecurity: No Food Insecurity (02/24/2022)   Hunger Vital Sign    Worried About Running Out of Food in the  Last Year: Never true    Ran Out of Food in the Last Year: Never true  Transportation Needs: No Transportation Needs (02/24/2022)   PRAPARE - Administrator, Civil Service (Medical): No    Lack of Transportation (Non-Medical): No  Physical Activity: Insufficiently Active (02/24/2022)   Exercise Vital Sign    Days of Exercise per Week: 1 day    Minutes of Exercise per Session: 20 min  Stress: Stress Concern Present (02/24/2022)   [employer] of Occupational Health - Occupational Stress Questionnaire    Feeling of Stress : To some extent  Social Connections: Moderately Isolated (02/24/2022)   Social Connection and Isolation Panel [NHANES]    Frequency of Communication with Friends and Family: More than three times a week    Frequency of Social Gatherings with Friends and Family: Three times a week    Attends Religious Services: 1 to 4 times per year    Active Member of Clubs or Organizations: No    Attends Banker Meetings: Never    Marital Status: Never married    Allergies:  Allergies  Allergen Reactions   Tamiflu [Oseltamivir Phosphate] Nausea And Vomiting   Alpha-Gal     Metabolic Disorder Labs: No results found for: "HGBA1C", "MPG" No results found for: "PROLACTIN" No results found for: "CHOL", "TRIG", "HDL", "CHOLHDL", "VLDL", "LDLCALC" No results found for: "TSH"  Therapeutic Level Labs: No results found for: "LITHIUM" No results found for: "VALPROATE" No results found for: "CBMZ"  Current Medications: Current Outpatient Medications  Medication Sig Dispense Refill   lisdexamfetamine (VYVANSE) 30 MG capsule Take 1 capsule (30 mg total) by mouth every morning. 30 capsule 0   lisdexamfetamine (VYVANSE) 30 MG capsule Take 1 capsule (30 mg total) by mouth every morning. 30 capsule 0   Calcium Carbonate Antacid (TUMS PO) Take by mouth.     diphenhydrAMINE HCl (BENADRYL ALLERGY PO) Take by mouth.     EPINEPHrine (EPIPEN 2-PAK) 0.3 mg/0.3 mL  IJ SOAJ injection Inject 0.3 mLs (0.3 mg total) into the muscle as needed for anaphylaxis. 2 each 0   hydrOXYzine (ATARAX) 10 MG tablet Take 2 tablets (20 mg total) by mouth 2 (two) times daily. 120 tablet 2   ibuprofen (ADVIL) 200 MG tablet Take by mouth. Takes 2 tabs as needed      lisdexamfetamine (VYVANSE) 30 MG capsule Take 1 capsule (30 mg total) by mouth every morning. 30 capsule 0   Norethindrone-Ethinyl Estradiol-Fe Biphas (LO LOESTRIN FE) 1 MG-10 MCG / 10 MCG tablet Take 1 tablet by mouth daily. 84 tablet 4   sertraline (ZOLOFT) 100 MG tablet Take 1 tablet (100 mg total) by mouth daily. 30 tablet 2  No current facility-administered medications for this visit.     Musculoskeletal: Strength & Muscle Tone: within normal limits Gait & Station: normal Patient leans: N/A  Psychiatric Specialty Exam: Review of Systems  All other systems reviewed and are negative.   Last menstrual period 05/16/2022.There is no height or weight on file to calculate BMI.  General Appearance: Casual and Fairly Groomed  Eye Contact:  Good  Speech:  Clear and Coherent  Volume:  Normal  Mood:  Euthymic  Affect:  Congruent  Thought Process:  Goal Directed  Orientation:  Full (Time, Place, and Person)  Thought Content: WDL   Suicidal Thoughts:  No  Homicidal Thoughts:  No  Memory:  Immediate;   Good Recent;   Good Remote;   Fair  Judgement:  Good  Insight:  Good  Psychomotor Activity:  Normal  Concentration:  Concentration: Good and Attention Span: Good  Recall:  Good  Fund of Knowledge: Good  Language: Good  Akathisia:  No  Handed:  Right  AIMS (if indicated): not done  Assets:  Communication Skills Desire for Improvement Physical Health Resilience Social Support Talents/Skills  ADL's:  Intact  Cognition: WNL  Sleep:  Good   Screenings: GAD-7    Flowsheet Row Office Visit from 02/24[facility]20[facility] in [facility] Counselor from 05/15/2020 in [facility] [place] Office Visit from 11/18/2019 in [facility]  Total GAD-7 Score 5 11 20       PHQ2-9    Flowsheet Row Office Visit from 02/24/2022 in [facility] Video Visit from 01/07/2022 in [facility] [place] Video Visit from 10/16/2021 in [facility] at [place] Video Visit from 08/09/2021 in [facility] at [place] Video Visit from 03/07/2021 in [facility] Total Score 0 0 2 0 0  PHQ-9 Total Score 5 -- 5 -- --      Flowsheet Row Video Visit from 01/07/2022 in [facility] at [place] Video Visit from 08/09/2021 in [facility] at [place] Video Visit from 03/07/2021 in 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  She is doing well on her current regimen.  She will continue Vyvanse 30 mg daily for ADD, Zoloft 100 mg daily for anxiety and hydroxyzine 20 mg twice daily for anxiety.  She will return to see me in 3 months  Collaboration of Care: Collaboration of Care: Primary Care Provider AEB notes will be shared with PCP at patient's request  Patient/Guardian was advised Release of Information must be obtained prior to any record release in order to collaborate their care with an outside provider. Patient/Guardian was advised if they have not already done so to contact the registration department to sign all necessary forms in order for [place] to release information regarding their care.   Consent: Patient/Guardian gives verbal consent for treatment and assignment of benefits for services provided  during this visit. Patient/Guardian expressed understanding and agreed to proceed.    [name], MD 07/07/2022, 10:55 AM

## 2022-07-07 NOTE — Telephone Encounter (Signed)
sent 

## 2022-07-08 ENCOUNTER — Other Ambulatory Visit (HOSPITAL_COMMUNITY): Payer: Self-pay

## 2022-07-08 ENCOUNTER — Other Ambulatory Visit: Payer: Self-pay

## 2022-07-09 ENCOUNTER — Other Ambulatory Visit (HOSPITAL_COMMUNITY): Payer: Self-pay

## 2022-08-09 ENCOUNTER — Ambulatory Visit
Admission: EM | Admit: 2022-08-09 | Discharge: 2022-08-09 | Disposition: A | Discharge status: Home / Self Care | Payer: 59 | Attending: Nurse Practitioner | Admitting: Nurse Practitioner

## 2022-08-09 DIAGNOSIS — J020 Streptococcal pharyngitis: Secondary | ICD-10-CM

## 2022-08-09 LAB — POCT RAPID STREP A (OFFICE): Rapid Strep A Screen: POSITIVE — AB

## 2022-08-09 MED ORDER — AMOXICILLIN 500 MG PO CAPS: 500.0000 mg | ORAL_CAPSULE | Freq: Two times a day (BID) | ORAL | 0 refills | Status: AC

## 2022-08-09 MED ORDER — LIDOCAINE VISCOUS HCL 2 % MT SOLN: 5.0000 mL | Freq: Four times a day (QID) | OROMUCOSAL | 0 refills | Status: DC | PRN

## 2022-08-09 NOTE — Discharge Instructions (Addendum)
Take medication as prescribed. Increase fluids and allow for plenty of rest. Recommend over-the-counter Tylenol or ibuprofen as needed for pain, fever, or general discomfort. Warm salt water gargles 3-4 times daily to help with throat pain or discomfort. Recommend a diet with soft foods to include soups, broths, puddings, yogurt, Jell-O's, or popsicles until symptoms improve. Change toothbrush after 3 days. Follow-up if symptoms do not improve.  

## 2022-08-09 NOTE — ED Provider Notes (Signed)
[facility]    CSN: [id number] Arrival date & time: 08/09/22  1001      History   Chief Complaint Chief Complaint  Patient presents with   Sore Throat   Generalized Body Aches    HPI Mary Johnson is a 22 y.o. female.   The history is provided by the patient.   The patient presents with a 2-day history of sore throat.  She also complains of fever, chills, and bodyaches that occurred last evening.  She denies headache, ear pain, nasal congestion, runny nose, cough, chest pain, abdominal pain, nausea, vomiting, or diarrhea.  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  She reports she has been taking ibuprofen and Aleve for her symptoms with minimal relief.  Past Medical History:  Diagnosis Date   Angio-edema    Anxiety    Concussion 09/2019   Screening examination for STD (sexually transmitted disease) 02/24/2022   Urticaria     Patient Active Problem List   Diagnosis Date Noted   Encounter for surveillance of contraceptive pills 06/10/2022   Acute bronchitis 03/04/2022   ADD (attention deficit disorder) 01/21/2013    Past Surgical History:  Procedure Laterality Date   TYMPANOSTOMY TUBE PLACEMENT      OB History     Gravida  0   Para  0   Term  0   Preterm  0   AB  0   Living  0      SAB  0   IAB  0   Ectopic  0   Multiple  0   Live Births  0            Home Medications    Prior to Admission medications   Medication Sig Start Date End Date Taking? Authorizing Provider  amoxicillin (AMOXIL) 500 MG capsule Take 1 capsule (500 mg total) by mouth 2 (two) times daily for 10 days. 08/09/22 08/19/22 Yes Chondra Boyde-Warren, Sadie Haber, NP  Calcium Carbonate Antacid (TUMS PO) Take by mouth.   Yes [provider]  diphenhydrAMINE HCl (BENADRYL ALLERGY PO) Take by mouth.   Yes [provider]  EPINEPHrine (EPIPEN 2-PAK) 0.3 mg/0.3 mL IJ SOAJ injection Inject 0.3 mLs (0.3 mg total) into the muscle as needed for  anaphylaxis. 11/18/19  Yes Ladona Ridgel, Malena M, DO  lidocaine (XYLOCAINE) 2 % solution Use as directed 5 mLs in the mouth or throat every 6 (six) hours as needed for mouth pain. Gargle and spit 5mL every 6 hours as needed for throat pain. 08/09/22  Yes Littie Chiem-Warren, Sadie Haber, NP  lisdexamfetamine (VYVANSE) 30 MG capsule Take 1 capsule (30 mg total) by mouth every morning. 07/07/22  Yes Myrlene Broker, MD  lisdexamfetamine (VYVANSE) 30 MG capsule Take 1 capsule (30 mg total) by mouth every morning. 09/06/22  Yes Myrlene Broker, MD  Norethindrone-Ethinyl Estradiol-Fe Biphas (LO LOESTRIN FE) 1 MG-10 MCG / 10 MCG tablet Take 1 tablet by mouth daily. 06/10/22  Yes Cyril Mourning A, NP  sertraline (ZOLOFT) 100 MG tablet Take 1 tablet (100 mg total) by mouth daily. 07/07/22  Yes Myrlene Broker, MD  hydrOXYzine (ATARAX) 10 MG tablet Take 2 tablets (20 mg total) by mouth 2 (two) times daily. 07/07/22   Myrlene Broker, MD  ibuprofen (ADVIL) 200 MG tablet Take by mouth. Takes 2 tabs as needed     [provider]  lisdexamfetamine (VYVANSE) 30 MG capsule Take 1 capsule (30 mg total) by mouth every  morning. 07/07/22   Myrlene Broker, MD    Family History Family History  Problem Relation Age of Onset   Cancer Paternal Grandfather        prostate   Kidney failure Paternal Grandfather    Dementia Paternal Grandmother    Breast cancer Maternal Grandmother    Anxiety disorder Maternal Grandmother    Depression Maternal Grandmother    Cancer Maternal Grandfather        prostate   Allergic rhinitis Father    Allergy (severe) Father        alpha-gal   Allergic rhinitis Mother    Allergy (severe) Mother        alpha-gal   Asthma Brother        exercise induced, now outgrown    Social History Social History   Tobacco Use   Smoking status: Never   Smokeless tobacco: Never  Vaping Use   Vaping Use: Never used  Substance Use Topics   Alcohol use: No   Drug use: Never     Allergies   Tamiflu  [oseltamivir phosphate] and Alpha-gal   Review of Systems Review of Systems Per HPI  Physical Exam Triage Vital Signs ED Triage Vitals [08/09/22 1012]  Enc Vitals Group     BP 108/72     Pulse Rate 82     Resp 18     Temp 98.2 F (36.8 C)     Temp Source Oral     SpO2 98 %     Weight      Height      Head Circumference      Peak Flow      Pain Score      Pain Loc      Pain Edu?      Excl. in GC?    No data found.  Updated Vital Signs BP 108/72 (BP Location: Left Arm)   Pulse 82   Temp 98.2 F (36.8 C) (Oral)   Resp 18   LMP 08/07/2022   SpO2 98%   Visual Acuity Right Eye Distance:   Left Eye Distance:   Bilateral Distance:    Right Eye Near:   Left Eye Near:    Bilateral Near:     Physical Exam Vitals and nursing note reviewed.  Constitutional:      General: She is not in acute distress.    Appearance: She is well-developed.  HENT:     Head: Normocephalic.     Right Ear: Tympanic membrane and ear canal normal.     Left Ear: Tympanic membrane and ear canal normal.     Nose: No congestion.     Mouth/Throat:     Mouth: Mucous membranes are moist.     Pharynx: Pharyngeal swelling and posterior oropharyngeal erythema present. No oropharyngeal exudate or uvula swelling.     Tonsils: No tonsillar exudate. 1+ on the right. 1+ on the left.  Eyes:     Conjunctiva/sclera: Conjunctivae normal.     Pupils: Pupils are equal, round, and reactive to light.  Cardiovascular:     Rate and Rhythm: Normal rate and regular rhythm.     Heart sounds: Normal heart sounds.  Pulmonary:     Effort: Pulmonary effort is normal. No respiratory distress.     Breath sounds: Normal breath sounds. No stridor. No wheezing, rhonchi or rales.  Abdominal:     General: Bowel sounds are normal.     Palpations: Abdomen is soft.  Tenderness: There is no abdominal tenderness.  Musculoskeletal:     Cervical back: Normal range of motion.  Lymphadenopathy:     Cervical: No cervical  adenopathy.  Skin:    General: Skin is warm and dry.  Neurological:     General: No focal deficit present.     Mental Status: She is alert and oriented to person, place, and time.  Psychiatric:        Mood and Affect: Mood normal.        Behavior: Behavior normal.      UC Treatments / Results  Labs (all labs ordered are listed, but only abnormal results are displayed) Labs Reviewed  POCT RAPID STREP A (OFFICE) - Abnormal; Notable for the following components:      Result Value   Rapid Strep A Screen Positive (*)    All other components within normal limits    EKG   Radiology No results found.  Procedures Procedures (including critical care time)  Medications Ordered in UC Medications - No data to display  Initial Impression / Assessment and Plan / UC Course  I have reviewed the triage vital signs and the nursing notes.  Pertinent labs & imaging results that were available during my care of the patient were reviewed by me and considered in my medical decision making (see chart for details).  The patient is well-appearing, she is in no acute distress, vital signs are stable.  Rapid strep test is positive.  Will treat patient with amoxicillin 500 mg twice daily for the next 10 days.  Patient was also prescribed viscous lidocaine 2% to gargle and spit for throat pain or discomfort.  Supportive care recommendations were provided and discussed with the patient to include continuing over-the-counter analgesics for pain or discomfort, warm salt water gargles, and a soft diet.  Patient advised to follow-up on improved.  Patient is in agreement with this plan of care and verbalizes understanding.  All questions were answered.  Patient stable for discharge.  Final Clinical Impressions(s) / UC Diagnoses   Final diagnoses:  Streptococcal sore throat     Discharge Instructions      Take medication as prescribed. Increase fluids and allow for plenty of rest. Recommend  over-the-counter Tylenol or ibuprofen as needed for pain, fever, or general discomfort. Warm salt water gargles 3-4 times daily to help with throat pain or discomfort. Recommend a diet with soft foods to include soups, broths, puddings, yogurt, Jell-O's, or popsicles until symptoms improve. Change toothbrush after 3 days. Follow-up if symptoms do not improve.     ED Prescriptions     Medication Sig Dispense Auth. Provider   amoxicillin (AMOXIL) 500 MG capsule Take 1 capsule (500 mg total) by mouth 2 (two) times daily for 10 days. 20 capsule Seraphine Gudiel-Warren, Sadie Haber, NP   lidocaine (XYLOCAINE) 2 % solution Use as directed 5 mLs in the mouth or throat every 6 (six) hours as needed for mouth pain. Gargle and spit 5mL every 6 hours as needed for throat pain. 100 mL Karilyn Wind-Warren, Sadie Haber, NP      PDMP not reviewed this encounter.   Abran Cantor, NP 08/09/22 1035

## 2022-08-09 NOTE — ED Triage Notes (Signed)
Pt is here for severe sore throat x 2 days. Pt reports fever,chills and body ache last night.

## 2022-08-20 ENCOUNTER — Other Ambulatory Visit (HOSPITAL_COMMUNITY): Payer: Self-pay

## 2022-08-28 ENCOUNTER — Telehealth: Payer: Self-pay

## 2022-08-28 NOTE — Telephone Encounter (Signed)
Pt mom Cuma Kopitzke wants Eber Jones to call her before her appt   [name] [phone]

## 2022-09-04 ENCOUNTER — Ambulatory Visit (INDEPENDENT_AMBULATORY_CARE_PROVIDER_SITE_OTHER): Payer: 59 | Admitting: Nurse Practitioner

## 2022-09-04 VITALS — BP 123/80 | HR 78 | Wt 193.2 lb

## 2022-09-04 DIAGNOSIS — R5383 Other fatigue: Secondary | ICD-10-CM

## 2022-09-04 DIAGNOSIS — Z13220 Encounter for screening for lipoid disorders: Secondary | ICD-10-CM

## 2022-09-04 DIAGNOSIS — F900 Attention-deficit hyperactivity disorder, predominantly inattentive type: Secondary | ICD-10-CM | POA: Diagnosis not present

## 2022-09-04 DIAGNOSIS — F419 Anxiety disorder, unspecified: Secondary | ICD-10-CM

## 2022-09-04 DIAGNOSIS — Z130 Encounter for screening for diseases of the blood and blood-forming organs and certain disorders involving the immune mechanism: Secondary | ICD-10-CM

## 2022-09-04 DIAGNOSIS — Z131 Encounter for screening for diabetes mellitus: Secondary | ICD-10-CM
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
## 2022-09-05 ENCOUNTER — Encounter: Payer: Self-pay | Admitting: Nurse Practitioner

## 2022-09-05 DIAGNOSIS — F419 Anxiety disorder, unspecified: Secondary | ICD-10-CM | POA: Insufficient documentation

## 2022-09-05 HISTORY — DX: Anxiety disorder, unspecified: F41.9

## 2022-09-08 ENCOUNTER — Telehealth (HOSPITAL_COMMUNITY): Payer: Self-pay

## 2022-09-08 NOTE — Telephone Encounter (Signed)
LVM for pt to return call to schedule follow up.

## 2022-09-10 ENCOUNTER — Other Ambulatory Visit (HOSPITAL_COMMUNITY): Payer: Self-pay

## 2022-10-01 ENCOUNTER — Other Ambulatory Visit (HOSPITAL_COMMUNITY): Payer: Self-pay

## 2022-10-06 ENCOUNTER — Telehealth (HOSPITAL_COMMUNITY): Payer: Self-pay | Admitting: Psychiatry

## 2022-10-13 ENCOUNTER — Other Ambulatory Visit (HOSPITAL_COMMUNITY): Payer: Self-pay

## 2022-10-29 ENCOUNTER — Telehealth (HOSPITAL_COMMUNITY): Payer: Self-pay | Admitting: *Deleted

## 2022-10-29 NOTE — Telephone Encounter (Signed)
Per pt mother voicemail, patient is needing refills for Vyvanse. Meds are going to [facility].  Baruch Merl at [facility] and they felt like they needed to go up on the Vyvanse and needed provider's thoughts.   Staff called primary number on file and [facility] informing patient to call office to schedule f/u appt due to last appt being in April.

## 2022-10-30 NOTE — Telephone Encounter (Signed)
Called pt no answer left vm 

## 2022-10-30 NOTE — Telephone Encounter (Signed)
I can't send it in or increase it until she is seen

## 2022-11-06 ENCOUNTER — Other Ambulatory Visit (HOSPITAL_COMMUNITY): Payer: Self-pay

## 2022-11-18 NOTE — Telephone Encounter (Signed)
Called Primary number on file to call patient to schedule f/u with provider. Staff was not able to reach patient and [facility]

## 2022-12-08 ENCOUNTER — Other Ambulatory Visit (HOSPITAL_COMMUNITY): Payer: Self-pay

## 2022-12-08 ENCOUNTER — Other Ambulatory Visit (HOSPITAL_COMMUNITY): Payer: Self-pay | Admitting: Psychiatry

## 2022-12-08 MED ORDER — SERTRALINE HCL 100 MG PO TABS
100.0000 mg | ORAL_TABLET | Freq: Every day | ORAL | 2 refills | Status: DC
  Filled 2022-12-08: qty 30, 30d supply, fill #0

## 2022-12-08 MED ORDER — HYDROXYZINE HCL 10 MG PO TABS
20.0000 mg | ORAL_TABLET | Freq: Two times a day (BID) | ORAL | 2 refills | Status: DC
  Filled 2022-12-08: qty 120, 30d supply, fill #0

## 2022-12-08 NOTE — Telephone Encounter (Signed)
Call for appt

## 2022-12-19 ENCOUNTER — Other Ambulatory Visit (HOSPITAL_COMMUNITY): Payer: Self-pay

## 2022-12-19 ENCOUNTER — Other Ambulatory Visit (HOSPITAL_COMMUNITY): Payer: Self-pay | Admitting: Psychiatry

## 2022-12-22 ENCOUNTER — Other Ambulatory Visit (HOSPITAL_COMMUNITY): Payer: Self-pay

## 2022-12-25 ENCOUNTER — Other Ambulatory Visit (HOSPITAL_COMMUNITY): Payer: Self-pay

## 2022-12-29 ENCOUNTER — Encounter (HOSPITAL_COMMUNITY): Payer: Self-pay | Admitting: Psychiatry

## 2022-12-29 ENCOUNTER — Telehealth (INDEPENDENT_AMBULATORY_CARE_PROVIDER_SITE_OTHER): Payer: 59 | Admitting: Psychiatry

## 2022-12-29 ENCOUNTER — Other Ambulatory Visit (HOSPITAL_COMMUNITY): Payer: Self-pay

## 2022-12-29 ENCOUNTER — Telehealth (HOSPITAL_COMMUNITY): Payer: Self-pay

## 2022-12-29 DIAGNOSIS — F411 Generalized anxiety disorder: Secondary | ICD-10-CM

## 2022-12-29 DIAGNOSIS — F900 Attention-deficit hyperactivity disorder, predominantly inattentive type: Secondary | ICD-10-CM

## 2022-12-29 MED ORDER — HYDROXYZINE HCL 10 MG PO TABS
20.0000 mg | ORAL_TABLET | Freq: Two times a day (BID) | ORAL | 2 refills | Status: DC
  Filled 2022-12-29 – 2023-02-03 (×2): qty 120, 30d supply, fill #0
  Filled 2023-03-30: qty 120, 30d supply, fill #1

## 2022-12-29 MED ORDER — SERTRALINE HCL 100 MG PO TABS
100.0000 mg | ORAL_TABLET | Freq: Every day | ORAL | 2 refills | Status: DC
  Filled 2022-12-29 – 2023-02-03 (×2): qty 30, 30d supply, fill #0
  Filled 2023-03-30: qty 30, 30d supply, fill #1

## 2022-12-29 MED ORDER — LISDEXAMFETAMINE DIMESYLATE 40 MG PO CAPS
40.0000 mg | ORAL_CAPSULE | ORAL | 0 refills | Status: DC
Start: 2022-12-29 — End: 2023-06-03
  Filled 2022-12-29: qty 30, 30d supply, fill #0

## 2022-12-29 MED ORDER — LISDEXAMFETAMINE DIMESYLATE 40 MG PO CAPS
40.0000 mg | ORAL_CAPSULE | ORAL | 0 refills | Status: DC
Start: 2022-12-29 — End: 2023-03-30
  Filled 2022-12-29 – 2023-03-05 (×2): qty 30, 30d supply, fill #0

## 2022-12-29 NOTE — Progress Notes (Signed)
Virtual Visit via Video Note  I connected with Mary Johnson on 12/29/22 at  2:40 PM EDT by a video enabled telemedicine application and verified that I am speaking with the correct person using two identifiers.  Location: Patient: home Provider: office   I discussed the limitations of evaluation and management by telemedicine and the availability of in person appointments. The patient expressed understanding and agreed to proceed.     I discussed the assessment and treatment plan with the patient. The patient was provided an opportunity to ask questions and all were answered. The patient agreed with the plan and demonstrated an understanding of the instructions.   The patient was advised to call back or seek an in-person evaluation if the symptoms worsen or if the condition fails to improve as anticipated.  I provided 15 minutes of non-face-to-face time during this encounter.   [name], MD  [facility] MD/PA/NP OP Progress Note  12/29/2022 2:52 PM Mary Johnson  MRN:  [id number]  Chief Complaint:  Chief Complaint  Patient presents with   Anxiety   Depression   ADD   Follow-up   HPI: This patient is a 22 year old white female who lives with her parents and older brother in [place].  She is in an adult GED program at [employer].  Currently she is not working but is about to start a new job soon.  The patient returns for follow-up after about 5 months regarding her anxiety and ADHD.  She states that she is going to finish her GED program in about 2 months.  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  She is not significantly depressed but she does have a fair amount of anxiety at times.  She particularly does not like to be separated from her boyfriend.  She states she has had separation anxiety "all my life."  The hydroxyzine does help.  She denies significant depression.   However she does state that the Vyvanse at 30 mg every morning is not lasting through the day and she is not able to stay very focused and would like an increase.  I think this is reasonable.  She is sleeping well and denies any thoughts of self-harm or suicide Visit Diagnosis:    ICD-10-CM   1. Generalized anxiety disorder  F41.1     2. Attention deficit hyperactivity disorder (ADHD), predominantly inattentive type  F90.0       Past Psychiatric History: Previous counseling  Past Medical History:  Past Medical History:  Diagnosis Date   Angio-edema    Anxiety    Concussion 09/2019   Screening examination for STD (sexually transmitted disease) 02/24/2022   Urticaria     Past Surgical History:  Procedure Laterality Date   TYMPANOSTOMY TUBE PLACEMENT      Family Psychiatric History: See below  Family History:  Family History  Problem Relation Age of Onset   Cancer Paternal Grandfather        prostate   Kidney failure Paternal Grandfather    Dementia Paternal Grandmother    Breast cancer Maternal Grandmother    Anxiety disorder Maternal Grandmother    Depression Maternal Grandmother    Cancer Maternal Grandfather        prostate   Allergic rhinitis Father    Allergy (severe) Father        alpha-gal   Allergic rhinitis Mother  Allergy (severe) Mother        alpha-gal   Asthma Brother        exercise induced, now outgrown    Social History:  Social History   Socioeconomic History   Marital status: Single    Spouse name: Not on file   Number of children: Not on file   Years of education: Not on file   Highest education level: 10th grade  Occupational History   Not on file  Tobacco Use   Smoking status: Never   Smokeless tobacco: Never  Vaping Use   Vaping status: Never Used  Substance and Sexual Activity   Alcohol use: No   Drug use: Never   Sexual activity: Yes    Birth control/protection: Pill  Other Topics Concern   Not on file  Social History  Narrative   Not on file   Social Determinants of Health   Financial Resource Strain: Low Risk  (09/01/2022)   Overall Financial Resource Strain (CARDIA)    Difficulty of Paying Living Expenses: Not very hard  Food Insecurity: No Food Insecurity (09/01/2022)   Hunger Vital Sign    Worried About Running Out of Food in the Last Year: Never true    Ran Out of Food in the Last Year: Never true  Transportation Needs: No Transportation Needs (09/01/2022)   PRAPARE - Administrator, Civil Service (Medical): No    Lack of Transportation (Non-Medical): No  Physical Activity: Insufficiently Active (09/01/2022)   Exercise Vital Sign    Days of Exercise per Week: 2 days    Minutes of Exercise per Session: 40 min  Stress: Stress Concern Present (09/01/2022)   [employer] of Occupational Health - Occupational Stress Questionnaire    Feeling of Stress : Rather much  Social Connections: Moderately Isolated (09/01/2022)   Social Connection and Isolation Panel [NHANES]    Frequency of Communication with Friends and Family: More than three times a week    Frequency of Social Gatherings with Friends and Family: More than three times a week    Attends Religious Services: More than 4 times per year    Active Member of [employer] or Organizations: No    Attends Banker Meetings: Never    Marital Status: Never married    Allergies:  Allergies  Allergen Reactions   Tamiflu [Oseltamivir Phosphate] Nausea And Vomiting   Alpha-Gal     Metabolic Disorder Labs: No results found for: "HGBA1C", "MPG" No results found for: "PROLACTIN" No results found for: "CHOL", "TRIG", "HDL", "CHOLHDL", "VLDL", "LDLCALC" No results found for: "TSH"  Therapeutic Level Labs: No results found for: "LITHIUM" No results found for: "VALPROATE" No results found for: "CBMZ"  Current Medications: Current Outpatient Medications  Medication Sig Dispense Refill   lisdexamfetamine (VYVANSE) 40 MG capsule Take  1 capsule (40 mg total) by mouth every morning. 30 capsule 0   lisdexamfetamine (VYVANSE) 40 MG capsule Take 1 capsule (40 mg total) by mouth every morning. 30 capsule 0   Calcium Carbonate Antacid (TUMS PO) Take by mouth.     diphenhydrAMINE HCl (BENADRYL ALLERGY PO) Take by mouth.     EPINEPHrine (EPIPEN 2-PAK) 0.3 mg/0.3 mL IJ SOAJ injection Inject 0.3 mLs (0.3 mg total) into the muscle as needed for anaphylaxis. 2 each 0   hydrOXYzine (ATARAX) 10 MG tablet Take 2 tablets (20 mg total) by mouth 2 (two) times daily. 120 tablet 2   ibuprofen (ADVIL) 200 MG tablet Take by mouth. Takes  2 tabs as needed      Norethindrone-Ethinyl Estradiol-Fe Biphas (LO LOESTRIN FE) 1 MG-10 MCG / 10 MCG tablet Take 1 tablet by mouth daily. 84 tablet 4   sertraline (ZOLOFT) 100 MG tablet Take 1 tablet (100 mg total) by mouth daily. 30 tablet 2   No current facility-administered medications for this visit.     Musculoskeletal: Strength & Muscle Tone: within normal limits Gait & Station: normal Patient leans: N/A  Psychiatric Specialty Exam: Review of Systems  Psychiatric/Behavioral:  Positive for decreased concentration.   All other systems reviewed and are negative.   There were no vitals taken for this visit.There is no height or weight on file to calculate BMI.  General Appearance: Casual and Fairly Groomed  Eye Contact:  Good  Speech:  Clear and Coherent  Volume:  Normal  Mood:  Euthymic  Affect:  Congruent  Thought Process:  Goal Directed  Orientation:  Full (Time, Place, and Person)  Thought Content: WDL   Suicidal Thoughts:  No  Homicidal Thoughts:  No  Memory:  Immediate;   Good Recent;   Good Remote;   Good  Judgement:  Good  Insight:  Fair  Psychomotor Activity:  Normal  Concentration:  Concentration: Fair and Attention Span: Fair  Recall:  Good  Fund of Knowledge: Good  Language: Good  Akathisia:  No  Handed:  Right  AIMS (if indicated): not done  Assets:  Communication  Skills Desire for Improvement Physical Health Resilience Social Support Talents/Skills  ADL's:  Intact  Cognition: WNL  Sleep:  Good   Screenings: GAD-7    Flowsheet Row Office Visit from 09/04/2022 in [facility] Office Visit from 02/24/2022 in [facility] Counselor from 05/15/2020 in [facility] [place] Office Visit from 11/18/2019 in [facility]  Total GAD-7 Score 15 5 11 20       PHQ2-9    Flowsheet Row Office Visit from 09/04/2022 in [facility] Office Visit from 02/24/2022 in [facility] for [facility] Healthcare at [facility] Video Visit from 01/07/2022 in [facility] [place] Video Visit from 10/16/2021 in [facility] at [place] Video Visit from 08/09/2021 in [facility] at [facility] Total Score 1 0 0 2 0  PHQ-9 Total Score 6 5 -- 5 --      Flowsheet Row ED from 08/09/2022 in [facility] at [facility] Video Visit from 01/07/2022 in [facility] at [place] Video Visit from 08/09/2021 in [facility] at [place]  C-SSRS RISK CATEGORY No Risk No Risk No Risk        Assessment and Plan: This patient is a 22 year old female with a history of separation anxiety, generalized anxiety agoraphobia depression and ADD.  Her anxiety is under fair control but she is not focusing that well.  She will therefore increase Vyvanse to 40 mg every morning for ADD.  She will continue Zoloft 100 mg daily for depression and hydroxyzine 20 mg twice daily for anxiety.  She will return to see me in 2 months  Collaboration of Care: Collaboration of Care: Primary Care Provider AEB notes are shared with PCP on the epic system  Patient/Guardian was advised Release of Information must be  obtained prior to any record release in order to collaborate their care with an outside provider. Patient/Guardian was  advised if they have not already done so to contact the registration department to sign all necessary forms in order for [place] to release information regarding their care.   Consent: Patient/Guardian gives verbal consent for treatment and assignment of benefits for services provided during this visit. Patient/Guardian expressed understanding and agreed to proceed.    [name], MD 12/29/2022, 2:53 PM

## 2022-12-29 NOTE — Telephone Encounter (Signed)
Pt's mom had left a vm stating that pt is needing a refill on vyvanse and that pt has called here multiple times. No vm's left from pt. Per chart pt no showed 10/06/22 and messages were sent to pharmacy and pt has been called to schedule an appt. Mom also mentioned in vm that they may need to increase medication. I called pt today no answer left vm.

## 2023-01-14 ENCOUNTER — Other Ambulatory Visit (HOSPITAL_COMMUNITY): Payer: Self-pay

## 2023-02-03 ENCOUNTER — Other Ambulatory Visit (HOSPITAL_COMMUNITY): Payer: Self-pay

## 2023-03-05 ENCOUNTER — Other Ambulatory Visit (HOSPITAL_COMMUNITY): Payer: Self-pay

## 2023-03-30 ENCOUNTER — Other Ambulatory Visit (HOSPITAL_COMMUNITY): Payer: Self-pay

## 2023-03-30 ENCOUNTER — Other Ambulatory Visit (HOSPITAL_COMMUNITY): Payer: Self-pay | Admitting: Psychiatry

## 2023-03-30 MED ORDER — LISDEXAMFETAMINE DIMESYLATE 40 MG PO CAPS
40.0000 mg | ORAL_CAPSULE | ORAL | 0 refills | Status: DC
Start: 2023-03-30 — End: 2023-06-03
  Filled 2023-03-30: qty 30, 30d supply, fill #0

## 2023-03-30 NOTE — Telephone Encounter (Signed)
Call for appt

## 2023-04-16 ENCOUNTER — Other Ambulatory Visit (HOSPITAL_COMMUNITY): Payer: Self-pay

## 2023-04-16 ENCOUNTER — Ambulatory Visit (INDEPENDENT_AMBULATORY_CARE_PROVIDER_SITE_OTHER): Payer: 59 | Admitting: Nurse Practitioner

## 2023-04-16 ENCOUNTER — Encounter: Payer: Self-pay | Admitting: Nurse Practitioner

## 2023-04-16 VITALS — BP 117/80 | HR 97 | Temp 98.2°F | Ht 62.0 in | Wt 199.6 lb

## 2023-04-16 DIAGNOSIS — L02416 Cutaneous abscess of left lower limb: Secondary | ICD-10-CM

## 2023-04-16 MED ORDER — DOXYCYCLINE HYCLATE 100 MG PO CAPS
100.0000 mg | ORAL_CAPSULE | Freq: Two times a day (BID) | ORAL | 2 refills | Status: DC
Start: 2023-04-16 — End: 2023-06-09
  Filled 2023-04-16: qty 14, 7d supply, fill #0

## 2023-04-16 NOTE — Patient Instructions (Signed)
Cognitive Behavioral Therapy

## 2023-04-16 NOTE — Progress Notes (Signed)
   Subjective:    Patient ID: Mary Johnson, female    DOB: 2000-08-07, 23 y.o.   MRN: [id number]  HPI Presents for complaints of a possible "ingrown hair" on the upper left thigh that began 5 days ago.  Became progressively hard warm and tender over the next several days.  Yesterday her mother applied hot compresses several times and pressed on the area to express some drainage.  Was very hard and painful at that time.  Better today.  No fever.  No drainage in her underwear.  Has had recurrent small pus bumps in the axillary and pelvic area but usually small and resolve on their own.        Objective:   Physical Exam NAD.  Alert, oriented.  Approximately 4 cm linear faintly ecchymotic soft fluctuant area noted in the left posterior upper inner thigh.  1 very small firm area noted.  Nontender to palpation.  No active drainage.  Has several other dark pink papules in the axillary area with 1 small pustule noted.  Also similar lesions noted near the abscess. Today's Vitals   04/16/23 1018  BP: 117/80  Pulse: 97  Temp: 98.2 F (36.8 C)  SpO2: 98%  Weight: 199 lb 9.6 oz (90.5 kg)  Height: 5\' 2"  (1.575 m)   Body mass index is 36.51 kg/m.        Assessment & Plan:  1. Abscess of left thigh (Primary) Meds ordered this encounter  Medications   doxycycline (VIBRAMYCIN) 100 MG capsule    Sig: Take 1 capsule (100 mg total) by mouth 2 (two) times daily.    Dispense:  14 capsule    Refill:  2    Supervising Provider:   Lilyan Punt A [[id number]]   Call back in 4-5 days if not resolved. Continue warm compresses or sitz baths. Warning signs reviewed.  Call back sooner if needed.  2 refills given on her doxycycline to have on hand in case of further abscesses.  Explained that this may also be due to her weight gain and possible PCOS. Recommend patient use backup method of contraceptive while taking doxycycline as a precaution.  Verbalizes understanding. Continue follow up with Dr. Tenny Craw.  Recommend she consider CBT to help with anxiety.

## 2023-06-03 ENCOUNTER — Encounter (HOSPITAL_COMMUNITY): Payer: Self-pay | Admitting: Psychiatry

## 2023-06-03 ENCOUNTER — Other Ambulatory Visit (HOSPITAL_COMMUNITY): Payer: Self-pay

## 2023-06-03 ENCOUNTER — Telehealth (INDEPENDENT_AMBULATORY_CARE_PROVIDER_SITE_OTHER): Admitting: Psychiatry

## 2023-06-03 DIAGNOSIS — F411 Generalized anxiety disorder: Secondary | ICD-10-CM | POA: Diagnosis not present

## 2023-06-03 DIAGNOSIS — F900 Attention-deficit hyperactivity disorder, predominantly inattentive type: Secondary | ICD-10-CM | POA: Diagnosis not present

## 2023-06-03 MED ORDER — SERTRALINE HCL 100 MG PO TABS
100.0000 mg | ORAL_TABLET | Freq: Every day | ORAL | 2 refills | Status: DC
  Filled 2023-06-03: qty 30, 30d supply, fill #0

## 2023-06-03 MED ORDER — HYDROXYZINE HCL 10 MG PO TABS
20.0000 mg | ORAL_TABLET | Freq: Two times a day (BID) | ORAL | 2 refills | Status: DC
  Filled 2023-06-03: qty 120, 30d supply, fill #0

## 2023-06-03 MED ORDER — SERTRALINE HCL 100 MG PO TABS
100.0000 mg | ORAL_TABLET | Freq: Every day | ORAL | 2 refills | Status: DC
  Filled 2023-06-03: qty 30, 30d supply, fill #0
  Filled 2023-06-28: qty 30, 30d supply, fill #1
  Filled 2023-07-31: qty 30, 30d supply, fill #2

## 2023-06-03 MED ORDER — LISDEXAMFETAMINE DIMESYLATE 40 MG PO CAPS
40.0000 mg | ORAL_CAPSULE | ORAL | 0 refills | Status: DC
  Filled 2023-06-03: qty 30, 30d supply, fill #0

## 2023-06-03 MED ORDER — HYDROXYZINE HCL 10 MG PO TABS
20.0000 mg | ORAL_TABLET | Freq: Two times a day (BID) | ORAL | 2 refills | Status: DC
  Filled 2023-06-03: qty 120, 30d supply, fill #0
  Filled 2023-06-28: qty 120, 30d supply, fill #1
  Filled 2023-07-31: qty 120, 30d supply, fill #2

## 2023-06-03 MED ORDER — LISDEXAMFETAMINE DIMESYLATE 40 MG PO CAPS
40.0000 mg | ORAL_CAPSULE | ORAL | 0 refills | Status: DC
Start: 2023-06-03 — End: 2023-10-11
  Filled 2023-06-03 – 2023-08-11 (×2): qty 30, 30d supply, fill #0

## 2023-06-03 NOTE — Progress Notes (Signed)
 Virtual Visit via Video Note  I connected with [name] on 06/03/23 at  9:00 AM EST by a video enabled telemedicine application and verified that I am speaking with the correct person using two identifiers.  Location: Patient: home Provider: office   I discussed the limitations of evaluation and management by telemedicine and the availability of in person appointments. The patient expressed understanding and agreed to proceed.    I discussed the assessment and treatment plan with the patient. The patient was provided an opportunity to ask questions and all were answered. The patient agreed with the plan and demonstrated an understanding of the instructions.   The patient was advised to call back or seek an in-person evaluation if the symptoms worsen or if the condition fails to improve as anticipated.  I provided 20 minutes of non-face-to-face time during this encounter.   [name], MD  [facility] MD/PA/NP OP Progress Note  06/03/2023 9:16 AM [name]  MRN:  [id number]  Chief Complaint:  Chief Complaint  Patient presents with   ADHD   Anxiety   Follow-up   HPI: This patient is a 23 year old white female lives with her parents and older brother in [place].  She is in an adult GED program at [employer].  She is currently unemployed.  The patient and her mother return for follow-up after about 6 months.  The patient has failed to reschedule some appointments.  She states that she ran out of all her medicines a couple of weeks ago.  She states that she tried to contact us but there are no messages in the chart.  She was taking Zoloft hydroxyzine and Vyvanse.  Since being off the medication the patient is much more obsessional and tearful.  She is constantly worried that her boyfriend is upset with her.  If he does not text her several times a day she gets very upset.  She is also overeating and has gained weight.  The Vyvanse has helped to some degree.   Her PCP thinks she might have PCOS and I urged him to see GI OB/GYN about this.  She probably be a good candidate to be on Ozempic or another GLP-1.  Since the patient is not doing well with out the medications we will restart them.  However I am very concerned about compliance as it has been 6 months since she was seen and I am sure the medications would have run out way before 2 weeks ago.  She states that she will try to do better. Visit Diagnosis:    ICD-10-CM   1. Generalized anxiety disorder  F41.1     2. Attention deficit hyperactivity disorder (ADHD), predominantly inattentive type  F90.0       Past Psychiatric History: Previous counseling  Past Medical History:  Past Medical History:  Diagnosis Date   Angio-edema    Anxiety    Concussion 09/2019   Screening examination for STD (sexually transmitted disease) 02/24/2022   Urticaria     Past Surgical History:  Procedure Laterality Date   TYMPANOSTOMY TUBE PLACEMENT      Family Psychiatric History: See below  Family History:  Family History  Problem Relation Age of Onset   Cancer Paternal Grandfather        prostate   Kidney failure Paternal Grandfather    Dementia Paternal Grandmother    Breast cancer Maternal Grandmother    Anxiety disorder Maternal Grandmother    Depression Maternal Grandmother  Cancer Maternal Grandfather        prostate   Allergic rhinitis Father    Allergy (severe) Father        alpha-gal   Allergic rhinitis Mother    Allergy (severe) Mother        alpha-gal   Asthma Brother        exercise induced, now outgrown    Social History:  Social History   Socioeconomic History   Marital status: Single    Spouse name: Not on file   Number of children: Not on file   Years of education: Not on file   Highest education level: 10th grade  Occupational History   Not on file  Tobacco Use   Smoking status: Never   Smokeless tobacco: Never  Vaping Use   Vaping status: Never Used   Substance and Sexual Activity   Alcohol use: No   Drug use: Never   Sexual activity: Yes    Birth control/protection: Pill  Other Topics Concern   Not on file  Social History Narrative   Not on file   Social Drivers of Health   Financial Resource Strain: Low Risk  (09/01/2022)   Overall Financial Resource Strain (CARDIA)    Difficulty of Paying Living Expenses: Not very hard  Food Insecurity: No Food Insecurity (09/01/2022)   Hunger Vital Sign    Worried About Running Out of Food in the Last Year: Never true    Ran Out of Food in the Last Year: Never true  Transportation Needs: No Transportation Needs (09/01/2022)   PRAPARE - Administrator, Civil Service (Medical): No    Lack of Transportation (Non-Medical): No  Physical Activity: Insufficiently Active (09/01/2022)   Exercise Vital Sign    Days of Exercise per Week: 2 days    Minutes of Exercise per Session: 40 min  Stress: Stress Concern Present (09/01/2022)   [employer] of Occupational Health - Occupational Stress Questionnaire    Feeling of Stress : Rather much  Social Connections: Moderately Isolated (09/01/2022)   Social Connection and Isolation Panel [NHANES]    Frequency of Communication with Friends and Family: More than three times a week    Frequency of Social Gatherings with Friends and Family: More than three times a week    Attends Religious Services: More than 4 times per year    Active Member of [employer] or Organizations: No    Attends Banker Meetings: Not on file    Marital Status: Never married    Allergies:  Allergies  Allergen Reactions   Tamiflu [Oseltamivir Phosphate] Nausea And Vomiting   Alpha-Gal     Metabolic Disorder Labs: No results found for: "HGBA1C", "MPG" No results found for: "PROLACTIN" No results found for: "CHOL", "TRIG", "HDL", "CHOLHDL", "VLDL", "LDLCALC" No results found for: "TSH"  Therapeutic Level Labs: No results found for: "LITHIUM" No results found  for: "VALPROATE" No results found for: "CBMZ"  Current Medications: Current Outpatient Medications  Medication Sig Dispense Refill   diphenhydrAMINE HCl (BENADRYL ALLERGY PO) Take by mouth.     doxycycline (VIBRAMYCIN) 100 MG capsule Take 1 capsule (100 mg total) by mouth 2 (two) times daily. 14 capsule 2   EPINEPHrine (EPIPEN 2-PAK) 0.3 mg/0.3 mL IJ SOAJ injection Inject 0.3 mLs (0.3 mg total) into the muscle as needed for anaphylaxis. 2 each 0   hydrOXYzine (ATARAX) 10 MG tablet Take 2 tablets (20 mg total) by mouth 2 (two) times daily. 120 tablet 2  ibuprofen (ADVIL) 200 MG tablet Take by mouth. Takes 2 tabs as needed      lisdexamfetamine (VYVANSE) 40 MG capsule Take 1 capsule (40 mg total) by mouth every morning. 30 capsule 0   lisdexamfetamine (VYVANSE) 40 MG capsule Take 1 capsule (40 mg total) by mouth every morning. 30 capsule 0   Norethindrone-Ethinyl Estradiol-Fe Biphas (LO LOESTRIN FE) 1 MG-10 MCG / 10 MCG tablet Take 1 tablet by mouth daily. 84 tablet 4   sertraline (ZOLOFT) 100 MG tablet Take 1 tablet (100 mg total) by mouth daily. 30 tablet 2   No current facility-administered medications for this visit.     Musculoskeletal: Strength & Muscle Tone: within normal limits Gait & Station: normal Patient leans: N/A  Psychiatric Specialty Exam: Review of Systems  Constitutional:  Positive for unexpected weight change.  Psychiatric/Behavioral:  Positive for decreased concentration. The patient is nervous/anxious.   All other systems reviewed and are negative.   There were no vitals taken for this visit.There is no height or weight on file to calculate BMI.  General Appearance: Casual and Fairly Groomed  Eye Contact:  Good  Speech:  Clear and Coherent  Volume:  Normal  Mood:  Anxious  Affect:  Tearful  Thought Process:  Goal Directed  Orientation:  Full (Time, Place, and Person)  Thought Content: Obsessions and Rumination   Suicidal Thoughts:  No  Homicidal Thoughts:   No  Memory:  Immediate;   Good Recent;   Good Remote;   NA  Judgement:  Fair  Insight:  Fair  Psychomotor Activity:  Normal  Concentration:  Concentration: Poor and Attention Span: Poor  Recall:  Fair  Fund of Knowledge: good  Language: Good  Akathisia:  No  Handed:  Right  AIMS (if indicated): not done  Assets:  Communication Skills Desire for Improvement Physical Health Resilience Social Support  ADL's:  Intact  Cognition: WNL  Sleep:  Good   Screenings: GAD-7    Flowsheet Row Office Visit from 04/16/2023 in [facility] Office Visit from 09/04/2022 in [facility] Office Visit from 02/24/2022 in [facility] Counselor from 05/15/2020 in [facility] [place] Office Visit from 11/18/2019 in [facility]  Total GAD-7 Score 14 15 5 11 20       PHQ2-9    Flowsheet Row Office Visit from 04/16/2023 in [facility] Office Visit from 09/04/2022 in [facility] Office Visit from 02/24/2022 in [facility] at [facility] Video Visit from 01/07/2022 in [facility] [place] Video Visit from 10/16/2021 in [facility] at [facility] Total Score 0 1 0 0 2  PHQ-9 Total Score 5 6 5  -- 5      Flowsheet Row ED from 08/09/2022 in [facility] at [facility] Video Visit from 01/07/2022 in [facility] at [place] Video Visit from 08/09/2021 in [facility] at [place]  C-SSRS RISK CATEGORY No Risk No Risk No Risk        Assessment and Plan: This patient is a 23 year old female with a 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  Since getting off medication she has not been doing well in terms of anxiety  and focus.  She will restart Zoloft 100 mg daily for anxiety and depression, Vyvanse 40  mg every morning for ADD and possible binge eating disorder and hydroxyzine 20 mg twice daily for anxiety.  Collaboration of Care: Collaboration of Care: Referral or follow-up with counselor/therapist AEB patient will be referred to therapist Florencia Reasons in our office  Patient/Guardian was advised Release of Information must be obtained prior to any record release in order to collaborate their care with an outside provider. Patient/Guardian was advised if they have not already done so to contact the registration department to sign all necessary forms in order for [place] to release information regarding their care.   Consent: Patient/Guardian gives verbal consent for treatment and assignment of benefits for services provided during this visit. Patient/Guardian expressed understanding and agreed to proceed.    [name], MD 06/03/2023, 9:16 AM

## 2023-06-09 ENCOUNTER — Ambulatory Visit: Admitting: Adult Health

## 2023-06-09 ENCOUNTER — Encounter: Payer: Self-pay | Admitting: Adult Health

## 2023-06-09 ENCOUNTER — Other Ambulatory Visit (HOSPITAL_COMMUNITY): Payer: Self-pay

## 2023-06-09 VITALS — BP 130/87 | HR 67 | Ht 62.0 in | Wt 200.0 lb

## 2023-06-09 DIAGNOSIS — Z1329 Encounter for screening for other suspected endocrine disorder: Secondary | ICD-10-CM | POA: Diagnosis not present

## 2023-06-09 DIAGNOSIS — L678 Other hair color and hair shaft abnormalities: Secondary | ICD-10-CM

## 2023-06-09 DIAGNOSIS — L709 Acne, unspecified: Secondary | ICD-10-CM

## 2023-06-09 DIAGNOSIS — R5383 Other fatigue: Secondary | ICD-10-CM | POA: Insufficient documentation

## 2023-06-09 DIAGNOSIS — Z131 Encounter for screening for diabetes mellitus: Secondary | ICD-10-CM

## 2023-06-09 DIAGNOSIS — R635 Abnormal weight gain: Secondary | ICD-10-CM | POA: Diagnosis not present

## 2023-06-09 HISTORY — DX: Encounter for screening for other suspected endocrine disorder: Z13.29

## 2023-06-09 HISTORY — DX: Encounter for screening for diabetes mellitus: Z13.1

## 2023-06-09 HISTORY — DX: Other fatigue: R53.83

## 2023-06-09 NOTE — Progress Notes (Signed)
  Subjective:     Patient ID: Mary Johnson, female   DOB: 2000/12/22, 23 y.o.   MRN: [id number]  HPI Mary Johnson is a 23 year old white female,single, G0P0, in complaining of hard time losing weight, acne esp with periods, facial hair and tired. Dr Tenny Craw suggested she talk about ?PCO, she had been off anxiety meds but back on now. She is eating better and going to the gym.  PCP is Dr Adriana Simas   Review of Systems  +hard time losing weight,  +acne esp with periods, + facial hair + tired.  Reviewed past medical,surgical, social and family history. Reviewed medications and allergies.     Objective:   Physical Exam BP 130/87 (BP Location: Left Arm, Patient Position: Sitting, Cuff Size: Normal)   Pulse 67   Ht 5\' 2"  (1.575 m)   Wt 200 lb (90.7 kg)   LMP 05/30/2023 (Exact Date)   BMI 36.58 kg/m  Skin warm and dry, has acne on her chin and that is where hair is now. . Neck: mid line trachea, normal thyroid, good ROM, no lymphadenopathy noted. Lungs: clear to ausculation bilaterally. Cardiovascular: regular rate and rhythm.     Fall risk is low  Upstream - 06/09/23 1048       Pregnancy Intention Screening   Does the patient want to become pregnant in the next year? No    Does the patient's partner want to become pregnant in the next year? No    Would the patient like to discuss contraceptive options today? No      Contraception Wrap Up   Current Method Oral Contraceptive    End Method Oral Contraceptive    Contraception Counseling Provided Yes            Co exam with Marylynn Pearson NP student   Assessment:     1. Abnormal facial hair +hair on chin Will check testosterone level  - Testosterone,Free and Total  2. Acne, unspecified acne type  3. Weight gain (Primary) Can't lose weight  Will check labs Continue eating good and going to the gym May refer to healthy Weight and Wellness  - Comprehensive metabolic panel - TSH + free T4 - Insulin, random If insulin resistant  consider metformin  4. Tired +tired at times Will check labs  - CBC - Comprehensive metabolic panel - TSH + free T4  5. Screening for diabetes mellitus - Hemoglobin A1c  6. Screening for thyroid disorder - TSH + free T4     Plan:     Follow up TBD

## 2023-06-15 LAB — COMPREHENSIVE METABOLIC PANEL
ALT: 15 IU/L (ref 0–32)
AST: 19 IU/L (ref 0–40)
Albumin: 4.4 g/dL (ref 4.0–5.0)
Alkaline Phosphatase: 109 IU/L — ABNORMAL HIGH (ref 42–106)
BUN/Creatinine Ratio: 21 (ref 9–23)
BUN: 13 mg/dL (ref 6–20)
Bilirubin Total: 0.4 mg/dL (ref 0.0–1.2)
CO2: 21 mmol/L (ref 20–29)
Calcium: 9.8 mg/dL (ref 8.7–10.2)
Chloride: 99 mmol/L (ref 96–106)
Creatinine, Ser: 0.63 mg/dL (ref 0.57–1.00)
Globulin, Total: 2.7 g/dL (ref 1.5–4.5)
Glucose: 81 mg/dL (ref 70–99)
Potassium: 4.3 mmol/L (ref 3.5–5.2)
Sodium: 141 mmol/L (ref 134–144)
Total Protein: 7.1 g/dL (ref 6.0–8.5)
eGFR: 130 mL/min/{1.73_m2} (ref >59)

## 2023-06-15 LAB — CBC
Hematocrit: 42.5 % (ref 34.0–46.6)
Hemoglobin: 14 g/dL (ref 11.1–15.9)
MCH: 29 pg (ref 26.6–33.0)
MCHC: 32.9 g/dL (ref 31.5–35.7)
MCV: 88 fL (ref 79–97)
Platelets: 217 10*3/uL (ref 150–450)
RBC: 4.83 x10E6/uL (ref 3.77–5.28)
RDW: 12.8 % (ref 11.7–15.4)
WBC: 6.1 10*3/uL (ref 3.4–10.8)

## 2023-06-15 LAB — TSH+FREE T4
Free T4: 1.13 ng/dL (ref 0.82–1.77)
TSH: 1.66 u[IU]/mL (ref 0.450–4.500)

## 2023-06-15 LAB — INSULIN, RANDOM: INSULIN: 9.8 u[IU]/mL (ref 2.6–24.9)

## 2023-06-15 LAB — TESTOSTERONE,FREE AND TOTAL
Testosterone, Free: 2.1 pg/mL (ref 0.0–4.2)
Testosterone: 36 ng/dL (ref 13–71)

## 2023-06-15 LAB — HEMOGLOBIN A1C
Est. average glucose Bld gHb Est-mCnc: 114 mg/dL
Hgb A1c MFr Bld: 5.6 % (ref 4.8–5.6)

## 2023-06-25 ENCOUNTER — Other Ambulatory Visit: Payer: Self-pay | Admitting: Adult Health

## 2023-06-26 ENCOUNTER — Other Ambulatory Visit: Payer: Self-pay | Admitting: Adult Health

## 2023-06-26 DIAGNOSIS — R635 Abnormal weight gain: Secondary | ICD-10-CM

## 2023-06-26 NOTE — Progress Notes (Signed)
 Sent to [facility] for weight loss management

## 2023-06-29 ENCOUNTER — Encounter (INDEPENDENT_AMBULATORY_CARE_PROVIDER_SITE_OTHER): Payer: Self-pay

## 2023-07-01 ENCOUNTER — Telehealth (HOSPITAL_COMMUNITY): Admitting: Psychiatry

## 2023-07-01 ENCOUNTER — Encounter (HOSPITAL_COMMUNITY): Payer: Self-pay

## 2023-07-08 ENCOUNTER — Telehealth (HOSPITAL_COMMUNITY): Admitting: Psychiatry

## 2023-07-08 ENCOUNTER — Encounter (HOSPITAL_COMMUNITY): Payer: Self-pay

## 2023-08-03 ENCOUNTER — Ambulatory Visit (HOSPITAL_COMMUNITY): Admitting: Psychiatry

## 2023-08-11 ENCOUNTER — Other Ambulatory Visit (HOSPITAL_COMMUNITY): Payer: Self-pay

## 2023-08-12 ENCOUNTER — Other Ambulatory Visit (HOSPITAL_COMMUNITY): Payer: Self-pay

## 2023-10-11 ENCOUNTER — Other Ambulatory Visit (HOSPITAL_COMMUNITY): Payer: Self-pay | Admitting: Psychiatry

## 2023-10-12 ENCOUNTER — Other Ambulatory Visit (HOSPITAL_COMMUNITY): Payer: Self-pay

## 2023-10-12 MED ORDER — HYDROXYZINE HCL 10 MG PO TABS
20.0000 mg | ORAL_TABLET | Freq: Two times a day (BID) | ORAL | 2 refills | Status: DC
  Filled 2023-10-12: qty 120, 30d supply, fill #0
  Filled 2023-12-10: qty 120, 30d supply, fill #1
  Filled 2024-01-11: qty 120, 30d supply, fill #2

## 2023-10-12 MED ORDER — SERTRALINE HCL 100 MG PO TABS
100.0000 mg | ORAL_TABLET | Freq: Every day | ORAL | 2 refills | Status: DC
  Filled 2023-10-12: qty 30, 30d supply, fill #0
  Filled 2023-12-10: qty 30, 30d supply, fill #1
  Filled 2024-01-11: qty 30, 30d supply, fill #2

## 2023-10-12 MED ORDER — LISDEXAMFETAMINE DIMESYLATE 40 MG PO CAPS
40.0000 mg | ORAL_CAPSULE | ORAL | 0 refills | Status: DC
  Filled 2023-10-12: qty 30, 30d supply, fill #0

## 2023-10-12 NOTE — Telephone Encounter (Signed)
 Call for appt

## 2024-01-11 ENCOUNTER — Other Ambulatory Visit (HOSPITAL_COMMUNITY): Payer: Self-pay | Admitting: Psychiatry

## 2024-01-11 ENCOUNTER — Other Ambulatory Visit: Payer: Self-pay

## 2024-01-12 ENCOUNTER — Encounter: Payer: Self-pay | Admitting: Adult Health

## 2024-01-12 ENCOUNTER — Other Ambulatory Visit (HOSPITAL_COMMUNITY): Payer: Self-pay

## 2024-01-12 ENCOUNTER — Other Ambulatory Visit (HOSPITAL_COMMUNITY)
Admission: RE | Admit: 2024-01-12 | Discharge: 2024-01-12 | Disposition: A | Discharge status: Home / Self Care | Source: Ambulatory Visit | Attending: Adult Health | Admitting: Adult Health

## 2024-01-12 ENCOUNTER — Ambulatory Visit (INDEPENDENT_AMBULATORY_CARE_PROVIDER_SITE_OTHER): Admitting: Adult Health

## 2024-01-12 VITALS — BP 131/85 | HR 92 | Ht 62.0 in | Wt 201.5 lb

## 2024-01-12 DIAGNOSIS — Z3041 Encounter for surveillance of contraceptive pills: Secondary | ICD-10-CM

## 2024-01-12 DIAGNOSIS — Z124 Encounter for screening for malignant neoplasm of cervix: Secondary | ICD-10-CM

## 2024-01-12 HISTORY — DX: Encounter for screening for malignant neoplasm of cervix: Z12.4

## 2024-01-12 MED ORDER — LO LOESTRIN FE 1 MG-10 MCG / 10 MCG PO TABS
1.0000 | ORAL_TABLET | Freq: Every day | ORAL | 4 refills | Status: AC
  Filled 2024-01-12: qty 84, 84d supply, fill #0
  Filled 2024-04-20: qty 84, 84d supply, fill #1

## 2024-01-12 NOTE — Progress Notes (Signed)
  Subjective:     Patient ID: Sharlot Bartley Buddy, female   DOB: 2000/07/24, 23 y.o.   MRN: [id number]  HPI Zaineb is a a 23 year old white female,single, G0P0, in to get refills on lo Loestrin  and she needs a pap.  PCP is Dr Bluford  Review of Systems She is happy with lo Loestrin  Has had sex, no problems Reviewed past medical,surgical, social and family history. Reviewed medications and allergies.     Objective:   Physical Exam BP 131/85 (BP Location: Right Arm, Patient Position: Sitting, Cuff Size: Large)   Pulse 92   Ht 5' 2 (1.575 m)   Wt 201 lb 8 oz (91.4 kg)   LMP 12/26/2023 (Approximate)   BMI 36.85 kg/m     Skin warm and dry. Lungs: clear to ausculation bilaterally. Cardiovascular: regular rate and rhythm.  Pelvic: external genitalia is normal in appearance no lesions, vagina: pink,urethra has no lesions or masses noted, cervix:nulliparous, pap with GC/CHL performed, uterus: normal size, shape and contour, non tender, no masses felt, adnexa: no masses or tenderness noted. Bladder is non tender and no masses felt.  Fall risk is low  Upstream - 01/12/24 0839       Pregnancy Intention Screening   Does the patient want to become pregnant in the next year? No    Does the patient's partner want to become pregnant in the next year? No    Would the patient like to discuss contraceptive options today? No      Contraception Wrap Up   Current Method Oral Contraceptive    End Method Oral Contraceptive    Contraception Counseling Provided Yes         Examination chaperoned by Clarita Salt LPN  Assessment:     1. Routine Papanicolaou smear Pap sent Pap in 3 years if negative - Cytology - PAP( [place])  2. Encounter for surveillance of contraceptive pills (Primary) Happy with lo loestrin , will refill Meds ordered this encounter  Medications   Norethindrone-Ethinyl Estradiol -Fe Biphas (LO LOESTRIN FE ) 1 MG-10 MCG / 10 MCG tablet    Sig: Take 1 tablet by mouth daily.     Dispense:  84 tablet    Refill:  4    BIN [id number], PCN [id number], GRP [id number]    Supervising Provider:   JAYNE VONN DEL [[id number]]       Plan:     Follow up in 1 year or sooner if needed

## 2024-01-13 ENCOUNTER — Ambulatory Visit: Payer: Self-pay | Admitting: Adult Health

## 2024-01-13 LAB — CYTOLOGY - PAP
Adequacy: ABSENT
Chlamydia: NEGATIVE
Comment: NEGATIVE
Comment: NORMAL
Diagnosis: NEGATIVE
Neisseria Gonorrhea: NEGATIVE

## 2024-01-17 ENCOUNTER — Other Ambulatory Visit (HOSPITAL_COMMUNITY): Payer: Self-pay

## 2024-01-17 MED ORDER — LISDEXAMFETAMINE DIMESYLATE 40 MG PO CAPS
40.0000 mg | ORAL_CAPSULE | ORAL | 0 refills | Status: DC
  Filled 2024-01-17: qty 30, 30d supply, fill #0

## 2024-01-17 NOTE — Telephone Encounter (Signed)
 Call for appt

## 2024-01-18 ENCOUNTER — Other Ambulatory Visit (HOSPITAL_COMMUNITY): Payer: Self-pay

## 2024-01-25 ENCOUNTER — Other Ambulatory Visit (HOSPITAL_COMMUNITY): Payer: Self-pay

## 2024-02-23 ENCOUNTER — Ambulatory Visit
Admission: EM | Admit: 2024-02-23 | Discharge: 2024-02-23 | Disposition: A | Discharge status: Home / Self Care | Attending: Nurse Practitioner | Admitting: Nurse Practitioner

## 2024-02-23 ENCOUNTER — Encounter: Payer: Self-pay | Admitting: Emergency Medicine

## 2024-02-23 DIAGNOSIS — H65493 Other chronic nonsuppurative otitis media, bilateral: Secondary | ICD-10-CM

## 2024-02-23 MED ORDER — MECLIZINE HCL 12.5 MG PO TABS: 12.5000 mg | ORAL_TABLET | Freq: Three times a day (TID) | ORAL | 0 refills | Status: DC | PRN

## 2024-02-23 NOTE — Discharge Instructions (Addendum)
 Start using Flonase nasal spray to help with the fluid behind the ears.  You can also take the meclizine  to help with the dizziness.  Seek care if symptoms persist or worsen despite treatment.

## 2024-02-23 NOTE — ED Provider Notes (Signed)
 [facility]    CSN: [id number] Arrival date & time: 02/23/24  1253      History   Chief Complaint No chief complaint on file.   HPI Mary Johnson is a 23 y.o. female.   Patient presents today with mom for 2-day history of bilateral ear pain, worse in the right ear.  She reports the other day, her ear popped and the pain temporarily improved.  She endorses intermittent pain in both ears now.  No fever, cough, congestion, sore throat recently.  Reports she began to feel dizzy last night and the dizziness feels like she is offkilter.  She reports it worsens when she moves her head too fast or when she goes from sitting to standing or any position changes.  Dizziness is not worse with rolling over in the bed.  No chest pain or shortness of breath or nausea or vomiting associate with the dizziness.  No double vision or blurred vision associated with the dizziness.  Patient attributes dizziness to likely uncontrolled anxiety.  Reports she is prescribed sertraline  twice daily and hydroxyzine  twice daily for anxiety but she recently stopped taking sertraline  because she kept forgetting it.      Past Medical History:  Diagnosis Date   Angio-edema    Anxiety    Concussion 09/2019   Screening examination for STD (sexually transmitted disease) 02/24/2022   Urticaria     Patient Active Problem List   Diagnosis Date Noted   Routine Papanicolaou smear 01/12/2024   Weight gain 06/09/2023   Acne 06/09/2023   Abnormal facial hair 06/09/2023   Screening for thyroid  disorder 06/09/2023   Screening for diabetes mellitus 06/09/2023   Tired 06/09/2023   Anxiety 09/05/2022   Encounter for surveillance of contraceptive pills 06/10/2022   Acute bronchitis 03/04/2022   ADD (attention deficit disorder) 01/21/2013    Past Surgical History:  Procedure Laterality Date   TYMPANOSTOMY TUBE PLACEMENT      OB History     Gravida  0   Para  0   Term  0   Preterm  0   AB  0    Living  0      SAB  0   IAB  0   Ectopic  0   Multiple  0   Live Births  0            Home Medications    Prior to Admission medications   Medication Sig Start Date End Date Taking? Authorizing Provider  meclizine  (ANTIVERT ) 12.5 MG tablet Take 1 tablet (12.5 mg total) by mouth 3 (three) times daily as needed for dizziness. 02/23/24  Yes Chandra Harlene LABOR, NP  diphenhydrAMINE  HCl (BENADRYL  ALLERGY  PO) Take by mouth.    [provider]  EPINEPHrine  (EPIPEN  2-PAK) 0.3 mg/0.3 mL IJ SOAJ injection Inject 0.3 mLs (0.3 mg total) into the muscle as needed for anaphylaxis. 11/18/19   Waddell Catholic M, DO  hydrOXYzine  (ATARAX ) 10 MG tablet Take 2 tablets (20 mg total) by mouth 2 (two) times daily. 10/12/23   Okey Barnie SAUNDERS, MD  ibuprofen  (ADVIL ) 200 MG tablet Take by mouth. Takes 2 tabs as needed     [provider]  lisdexamfetamine (VYVANSE ) 40 MG capsule Take 1 capsule (40 mg total) by mouth every morning. 06/03/23   Okey Barnie SAUNDERS, MD  lisdexamfetamine (VYVANSE ) 40 MG capsule Take 1 capsule (40 mg total) by mouth every morning. 01/17/24   Okey Barnie SAUNDERS, MD  Norethindrone-Ethinyl Estradiol -Fe  Biphas (LO LOESTRIN FE ) 1 MG-10 MCG / 10 MCG tablet Take 1 tablet by mouth daily. 01/12/24   Signa Delon LABOR, NP  sertraline  (ZOLOFT ) 100 MG tablet Take 1 tablet (100 mg total) by mouth daily. 10/12/23   Okey Barnie SAUNDERS, MD    Family History Family History  Problem Relation Age of Onset   Cancer Paternal Grandfather        prostate   Kidney failure Paternal Grandfather    Dementia Paternal Grandmother    Breast cancer Maternal Grandmother    Anxiety disorder Maternal Grandmother    Depression Maternal Grandmother    Cancer Maternal Grandfather        prostate   Allergic rhinitis Father    Allergy  (severe) Father        alpha-gal   Allergic rhinitis Mother    Allergy  (severe) Mother        alpha-gal   Asthma Brother        exercise induced, now outgrown     Social History Social History   Tobacco Use   Smoking status: Never   Smokeless tobacco: Never  Vaping Use   Vaping status: Never Used  Substance Use Topics   Alcohol use: No   Drug use: Never     Allergies   Tamiflu  [oseltamivir  phosphate] and Alpha-gal   Review of Systems Review of Systems Per HPI  Physical Exam Triage Vital Signs ED Triage Vitals  Encounter Vitals Group     BP 02/23/24 1317 125/84     Girls Systolic BP Percentile --      Girls Diastolic BP Percentile --      Boys Systolic BP Percentile --      Boys Diastolic BP Percentile --      Pulse Rate 02/23/24 1317 86     Resp 02/23/24 1317 18     Temp 02/23/24 1317 98.3 F (36.8 C)     Temp Source 02/23/24 1317 Oral     SpO2 02/23/24 1317 98 %     Weight --      Height --      Head Circumference --      Peak Flow --      Pain Score 02/23/24 1318 4     Pain Loc --      Pain Education --      Exclude from Growth Chart --    No data found.  Updated Vital Signs BP 125/84 (BP Location: Right Arm)   Pulse 86   Temp 98.3 F (36.8 C) (Oral)   Resp 18   LMP 01/31/2024 (Exact Date)   SpO2 98%   Visual Acuity Right Eye Distance:   Left Eye Distance:   Bilateral Distance:    Right Eye Near:   Left Eye Near:    Bilateral Near:     Physical Exam Vitals and nursing note reviewed.  Constitutional:      General: She is not in acute distress.    Appearance: Normal appearance. She is not toxic-appearing.  HENT:     Head: Normocephalic and atraumatic.     Right Ear: A middle ear effusion is present. There is no impacted cerumen. Tympanic membrane is scarred and bulging.     Left Ear: A middle ear effusion is present. There is no impacted cerumen. Tympanic membrane is scarred.     Nose: Nose normal. No congestion or rhinorrhea.     Mouth/Throat:     Mouth: Mucous membranes are moist.  Pharynx: Oropharynx is clear.  Cardiovascular:     Rate and Rhythm: Normal rate.  Pulmonary:     Effort:  Pulmonary effort is normal. No respiratory distress.     Breath sounds: Normal breath sounds. No wheezing, rhonchi or rales.  Musculoskeletal:     Cervical back: Normal range of motion.  Lymphadenopathy:     Cervical: No cervical adenopathy.  Skin:    General: Skin is warm and dry.     Coloration: Skin is not jaundiced or pale.     Findings: No erythema.  Neurological:     Mental Status: She is alert and oriented to person, place, and time.  Psychiatric:        Behavior: Behavior is cooperative.      UC Treatments / Results  Labs (all labs ordered are listed, but only abnormal results are displayed) Labs Reviewed - No data to display  EKG   Radiology No results found.  Procedures Procedures (including critical care time)  Medications Ordered in UC Medications - No data to display  Initial Impression / Assessment and Plan / UC Course  I have reviewed the triage vital signs and the nursing notes.  Pertinent labs & imaging results that were available during my care of the patient were reviewed by me and considered in my medical decision making (see chart for details).   In triage, vital signs are stable and patient is well-appearing.  On examination, patient has bilateral ear effusions, worse in the right ear.  Start Flonase nasal spray and meclizine  for the dizziness.  Follow-up here or with ENT if symptoms persist/worsen despite treatment.  The patient was given the opportunity to ask questions.  All questions answered to their satisfaction.  The patient is in agreement to this plan.   Final Clinical Impressions(s) / UC Diagnoses   Final diagnoses:  Chronic middle ear effusion, bilateral     Discharge Instructions      Start using Flonase nasal spray to help with the fluid behind the ears.  You can also take the meclizine  to help with the dizziness.  Seek care if symptoms persist or worsen despite treatment.    ED Prescriptions     Medication Sig Dispense  Auth. Provider   meclizine  (ANTIVERT ) 12.5 MG tablet Take 1 tablet (12.5 mg total) by mouth 3 (three) times daily as needed for dizziness. 30 tablet Chandra Harlene LABOR, NP      PDMP not reviewed this encounter.   Chandra Harlene LABOR, NP 02/23/24 1434

## 2024-02-23 NOTE — ED Triage Notes (Signed)
 Bilateral ear pain x 2 days.  States right hurts worse than left.  Started to feel dizzy last night.  Feels dizzy when moving too fast for first standing up.

## 2024-03-02 ENCOUNTER — Telehealth (INDEPENDENT_AMBULATORY_CARE_PROVIDER_SITE_OTHER): Admitting: Psychiatry

## 2024-03-02 ENCOUNTER — Encounter (HOSPITAL_COMMUNITY): Payer: Self-pay | Admitting: Psychiatry

## 2024-03-02 ENCOUNTER — Other Ambulatory Visit (HOSPITAL_COMMUNITY): Payer: Self-pay

## 2024-03-02 DIAGNOSIS — F900 Attention-deficit hyperactivity disorder, predominantly inattentive type: Secondary | ICD-10-CM | POA: Diagnosis not present

## 2024-03-02 DIAGNOSIS — F411 Generalized anxiety disorder: Secondary | ICD-10-CM | POA: Diagnosis not present

## 2024-03-02 MED ORDER — LISDEXAMFETAMINE DIMESYLATE 30 MG PO CAPS
30.0000 mg | ORAL_CAPSULE | ORAL | 0 refills | Status: DC
  Filled 2024-03-02: qty 30, 30d supply, fill #0

## 2024-03-02 MED ORDER — CLONAZEPAM 0.5 MG PO TABS
0.5000 mg | ORAL_TABLET | Freq: Two times a day (BID) | ORAL | 0 refills | Status: DC | PRN
  Filled 2024-03-02: qty 60, 30d supply, fill #0

## 2024-03-02 MED ORDER — SERTRALINE HCL 100 MG PO TABS
100.0000 mg | ORAL_TABLET | Freq: Every day | ORAL | 2 refills | Status: DC
  Filled 2024-03-02 – 2024-03-09 (×2): qty 30, 30d supply, fill #0

## 2024-03-02 NOTE — Progress Notes (Signed)
 Virtual Visit via Video Note  I connected with Mary Johnson on 03/02/24 at 11:40 AM EST by a video enabled telemedicine application and verified that I am speaking with the correct person using two identifiers.  Location: Patient: home Provider: office   I discussed the limitations of evaluation and management by telemedicine and the availability of in person appointments. The patient expressed understanding and agreed to proceed.      I discussed the assessment and treatment plan with the patient. The patient was provided an opportunity to ask questions and all were answered. The patient agreed with the plan and demonstrated an understanding of the instructions.   The patient was advised to call back or seek an in-person evaluation if the symptoms worsen or if the condition fails to improve as anticipated.  I provided 20 minutes of non-face-to-face time during this encounter.   Barnie Gull, MD  [facility] MD/PA/NP OP Progress Note  03/02/2024 12:05 PM Mary Johnson  MRN:  [id number]  Chief Complaint:  Chief Complaint  Patient presents with   ADD   Anxiety   Follow-up   HPI: This patient is a 23 year old white female who lives with her parents and older brother in [place].  She is doing a Software Engineer.  She is also working at [employer].  The patient and mother return for follow-up after about 9 months.  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  The patient again has failed to reschedule appointments.  She has also gotten off her medications.  Apparently because of this she has gotten very anxious again.  Is almost to the point of agoraphobia.  She is scared when she is left alone at home or when she has to go out of the home.  She had a panic attack at a football game.  She has been having chest pain that has been attributed to anxiety.  She had recent lab work that was totally normal.  The patient stated she went back on the Zoloft  100 mg  about 4 days ago.  The hydroxyzine  helps to some degree but not all that much.  The Vyvanse  makes her not eat so she has not been taking it lately.  I suggested that we cut this back to a lower dose.  At this point she might need a benzodiazepine such as clonazepam to help with the acute anxiety attacks. Visit Diagnosis:    ICD-10-CM   1. Generalized anxiety disorder  F41.1     2. Attention deficit hyperactivity disorder (ADHD), predominantly inattentive type  F90.0       Past Psychiatric History: Previous counseling  Past Medical History:  Past Medical History:  Diagnosis Date   Angio-edema    Anxiety    Concussion 09/2019   Screening examination for STD (sexually transmitted disease) 02/24/2022   Urticaria     Past Surgical History:  Procedure Laterality Date   TYMPANOSTOMY TUBE PLACEMENT      Family Psychiatric History: See below  Family History:  Family History  Problem Relation Age of Onset   Cancer Paternal Grandfather        prostate   Kidney failure Paternal Grandfather    Dementia Paternal Grandmother    Breast cancer Maternal Grandmother    Anxiety disorder Maternal Grandmother    Depression Maternal Grandmother    Cancer Maternal Grandfather        prostate   Allergic rhinitis Father    Allergy  (severe) Father  alpha-gal   Allergic rhinitis Mother    Allergy  (severe) Mother        alpha-gal   Asthma Brother        exercise induced, now outgrown    Social History:  Social History   Socioeconomic History   Marital status: Single    Spouse name: Not on file   Number of children: Not on file   Years of education: Not on file   Highest education level: 10th grade  Occupational History   Not on file  Tobacco Use   Smoking status: Never   Smokeless tobacco: Never  Vaping Use   Vaping status: Never Used  Substance and Sexual Activity   Alcohol use: No   Drug use: Never   Sexual activity: Yes    Birth control/protection: Pill  Other Topics  Concern   Not on file  Social History Narrative   Not on file   Social Drivers of Health   Financial Resource Strain: Low Risk  (09/01/2022)   Overall Financial Resource Strain (CARDIA)    Difficulty of Paying Living Expenses: Not very hard  Food Insecurity: No Food Insecurity (09/01/2022)   Hunger Vital Sign    Worried About Running Out of Food in the Last Year: Never true    Ran Out of Food in the Last Year: Never true  Transportation Needs: No Transportation Needs (09/01/2022)   PRAPARE - Administrator, Civil Service (Medical): No    Lack of Transportation (Non-Medical): No  Physical Activity: Insufficiently Active (09/01/2022)   Exercise Vital Sign    Days of Exercise per Week: 2 days    Minutes of Exercise per Session: 40 min  Stress: Stress Concern Present (09/01/2022)   [employer] of Occupational Health - Occupational Stress Questionnaire    Feeling of Stress : Rather much  Social Connections: Moderately Isolated (09/01/2022)   Social Connection and Isolation Panel    Frequency of Communication with Friends and Family: More than three times a week    Frequency of Social Gatherings with Friends and Family: More than three times a week    Attends Religious Services: More than 4 times per year    Active Member of [employer] or Organizations: No    Attends Banker Meetings: Not on file    Marital Status: Never married    Allergies:  Allergies  Allergen Reactions   Tamiflu  [Oseltamivir  Phosphate] Nausea And Vomiting   Alpha-Gal     Metabolic Disorder Labs: Lab Results  Component Value Date   HGBA1C 5.6 06/09/2023   No results found for: PROLACTIN No results found for: CHOL, TRIG, HDL, CHOLHDL, VLDL, LDLCALC Lab Results  Component Value Date   TSH 1.660 06/09/2023    Therapeutic Level Labs: No results found for: LITHIUM No results found for: VALPROATE No results found for: CBMZ  Current Medications: Current Outpatient  Medications  Medication Sig Dispense Refill   clonazePAM (KLONOPIN) 0.5 MG tablet Take 1 tablet (0.5 mg total) by mouth 2 (two) times daily as needed for anxiety. 60 tablet 0   lisdexamfetamine (VYVANSE ) 30 MG capsule Take 1 capsule (30 mg total) by mouth every morning. 30 capsule 0   lisdexamfetamine (VYVANSE ) 30 MG capsule Take 1 capsule (30 mg total) by mouth every morning. 30 capsule 0   diphenhydrAMINE  HCl (BENADRYL  ALLERGY  PO) Take by mouth.     EPINEPHrine  (EPIPEN  2-PAK) 0.3 mg/0.3 mL IJ SOAJ injection Inject 0.3 mLs (0.3 mg total) into the muscle as  needed for anaphylaxis. 2 each 0   hydrOXYzine  (ATARAX ) 10 MG tablet Take 2 tablets (20 mg total) by mouth 2 (two) times daily. 120 tablet 2   ibuprofen  (ADVIL ) 200 MG tablet Take by mouth. Takes 2 tabs as needed      meclizine  (ANTIVERT ) 12.5 MG tablet Take 1 tablet (12.5 mg total) by mouth 3 (three) times daily as needed for dizziness. 30 tablet 0   Norethindrone-Ethinyl Estradiol -Fe Biphas (LO LOESTRIN FE ) 1 MG-10 MCG / 10 MCG tablet Take 1 tablet by mouth daily. 84 tablet 4   sertraline  (ZOLOFT ) 100 MG tablet Take 1 tablet (100 mg total) by mouth daily. 30 tablet 2   No current facility-administered medications for this visit.     Musculoskeletal: Strength & Muscle Tone: within normal limits Gait & Station: normal Patient leans: N/A  Psychiatric Specialty Exam: Review of Systems  Psychiatric/Behavioral:  The patient is nervous/anxious.   All other systems reviewed and are negative.   Last menstrual period 01/31/2024.There is no height or weight on file to calculate BMI.  General Appearance: Casual and Fairly Groomed  Eye Contact:  Good  Speech:  Clear and Coherent  Volume:  Normal  Mood:  Anxious  Affect:  Congruent  Thought Process:  Goal Directed  Orientation:  Full (Time, Place, and Person)  Thought Content: Rumination   Suicidal Thoughts:  No  Homicidal Thoughts:  No  Memory:  Immediate;   Good Recent;    Good Remote;   Good  Judgement:  Fair  Insight:  Fair  Psychomotor Activity:  Normal  Concentration:  Concentration: Poor and Attention Span: Poor  Recall:  Good  Fund of Knowledge: Good  Language: Good  Akathisia:  No  Handed:  Right  AIMS (if indicated): not done  Assets:  Communication Skills Desire for Improvement Physical Health Resilience Social Support Talents/Skills  ADL's:  Intact  Cognition: WNL  Sleep:  Good   Screenings: GAD-7    Flowsheet Row Office Visit from 04/16/2023 in [facility] Office Visit from 09/04/2022 in [facility] Office Visit from 02/24/2022 in [facility] Counselor from 05/15/2020 in [facility] [place] Office Visit from 11/18/2019 in [facility]  Total GAD-7 Score 14 15 5 11 20    PHQ2-9    Flowsheet Row Office Visit from 04/16/2023 in [facility] Office Visit from 09/04/2022 in [facility] Office Visit from 02/24/2022 in [facility] for [facility] Video Visit from 01/07/2022 in [facility] [place] Video Visit from 10/16/2021 in [facility] Total Score 0 1 0 0 2  PHQ-9 Total Score 5 6 5  -- 5   Flowsheet Row UC from 02/23/2024 in [facility] [place] UC from 08/09/2022 in [facility] Video Visit from 01/07/2022 in [facility] at [place]  C-SSRS RISK CATEGORY No Risk No Risk No Risk     Assessment and Plan: This patient is a 23 year old female with a history of generalized anxiety disorder agoraphobia and ADD.  She has not been doing well off medications and has become much more anxious.  She has already restarted Zoloft  100 mg daily for anxiety and depression.  We will  discontinue hydroxyzine  in favor of clonazepam 0.5 mg twice daily as needed for anxiety.  She can continue Vyvanse  but will cut down the dosage to 30 mg every morning for ADD.  She will return to see me in 6 weeks  Collaboration of Care: Collaboration of Care: Referral or follow-up with counselor/therapist AEB patient will be referred back to a therapist in our office  Patient/Guardian was advised Release of Information must be obtained prior to any record release in order to collaborate their care with an outside provider. Patient/Guardian was advised if they have not already done so to contact the registration department to sign all necessary forms in order for us  to release information regarding their care.   Consent: Patient/Guardian gives verbal consent for treatment and assignment of benefits for services provided during this visit. Patient/Guardian expressed understanding and agreed to proceed.    Barnie Gull, MD 03/02/2024, 12:05 PM

## 2024-03-07 ENCOUNTER — Ambulatory Visit (HOSPITAL_COMMUNITY)

## 2024-03-07 DIAGNOSIS — F411 Generalized anxiety disorder: Secondary | ICD-10-CM | POA: Diagnosis not present

## 2024-03-07 DIAGNOSIS — F900 Attention-deficit hyperactivity disorder, predominantly inattentive type: Secondary | ICD-10-CM

## 2024-03-07 NOTE — Progress Notes (Unsigned)
 Comprehensive Clinical Assessment (CCA) Note  03/07/2024  8:03 AM - 9:04 AM Mary Johnson Mary Johnson [id number]  Chief Complaint:  Chief Complaint  Patient presents with   Establish Care    When I think about leaving the house I worry I will have a panic attack.   Anxiety   Visit Diagnosis: Generalized Anxiety Disorder   What Is the Reason for Your Visit/Call Today? Increasing anxiety/panic How Long Has This Been Causing You Problems? 1 month What Do You Feel Would Help You the Most Today? Establish therapy services so I can talk to someone about how to cope better  Have You Recently Been in Any Inpatient Treatment (Hospital/Detox/Crisis Center/28-Day Program)? no   Have You Ever Received Services From [facility] Before? yes Who Do You See at [facility]? Dr Ross/Peggy Gardenia in the past  Have You Recently Had Any Thoughts About Hurting Yourself? no Are You Planning to Commit Suicide/Harm Yourself At This time? no  Have you Recently Had Thoughts About Hurting Someone Else? no  Have You Used Any Alcohol or Drugs in the Past 24 Hours? no How Long Ago Did You Use Drugs or Alcohol? One drink on my 23st birthday   Do You Currently Have a Therapist/Psychiatrist? Yes, psychiatrist Name of Therapist/Psychiatrist: Dr. Ross/Psychiatrist at [facility]  Have You Been Recently Discharged From Any Office Practice or Programs? No data recorded Explanation of Discharge From Practice/Program: No data recorded   Does Patient Have a Court Appointed Legal Guardian? no  Patient Determined To Be At Risk for Harm To Self or Others Based on Review of Patient Reported Information or Presenting Complaint? Mary Johnson denied any current or past history of self-harm or suicidal thoughts/actions.  Location of Assessment: [facility] office.   [place] of Residence: [place]  Patient Currently Receiving the Following Services: Outpatient Psychiatry with Dr. Okey.   CCA  Biopsychosocial Intake/Chief Complaint:  I stay at home too much.  I worry I will have a panic attack in public  Current Symptoms/Problems: Panic attacks consisting of hot flashes, rapid breathing, chest paints, dizziness which interfere with her leaving the home and going to certain large stores or events, even with family.   Patient Reported Schizophrenia/Schizoaffective Diagnosis in Past: No   Strengths: I try to keep a smile on my face  i like helping people  Preferences: In-person  Abilities: Working on [employer], wants to become a engineer, petroleum   Type of Services Patient Feels are Needed: Medication management. counseling to help cope better with anxiety and give feedback on improved coping.   Initial Clinical Notes/Concerns: Pt previously saw Winton Gardenia for counseling for anxiety and felt it was helpful.  She recently stopp taking her medications for 1-2 months and had a return of anxiety and panic symptoms.   Mental Health Symptoms Depression:  Irritability   Duration of Depressive symptoms: Greater than two weeks   Mania:  None   Anxiety:   Difficulty concentrating; Fatigue; Irritability; Restlessness; Tension; Worrying   Psychosis:  None   Duration of Psychotic symptoms: No data recorded  Trauma:  N/A   Obsessions:  None   Compulsions:  None   Inattention:  Avoids/dislikes activities that require focus; Disorganized; Poor follow-through on tasks (Mainly with school work.)   Hyperactivity/Impulsivity:  N/A   Oppositional/Defiant Behaviors:  None   Emotional Irregularity:  None   Other Mood/Personality Symptoms:  No data recorded   Mental Status Exam Appearance and self-care  Stature:  Average   Weight:  Overweight   Clothing:  Casual   Grooming:  Normal   Cosmetic use:  Age appropriate   Posture/gait:  Slumped   Motor activity:  Not Remarkable   Sensorium  Attention:  Normal   Concentration:  Normal   Orientation:  X5    Recall/memory:  Normal   Affect and Mood  Affect:  Anxious   Mood:  Anxious   Relating  Eye contact:  Normal   Facial expression:  Anxious   Attitude toward examiner:  Cooperative   Thought and Language  Speech flow: Clear and Coherent   Thought content:  Appropriate to Mood and Circumstances   Preoccupation:  Other (Comment) (Worries about panic attacks.)   Hallucinations:  None   Organization:  No data recorded  [employer] of Knowledge:  Average   Intelligence:  Average   Abstraction:  No data recorded  Judgement:  Common-sensical   Reality Testing:  Adequate   Insight:  Fair   Decision Making:  Paralyzed   Social Functioning  Social Maturity:  Isolates   Social Judgement:  Normal   Stress  Stressors:  No data recorded  Coping Ability:  Human Resources Officer Deficits:  Decision making   Supports:  Family; Friends/Service system     Religion: Religion/Spirituality Are You A Religious Person?: No  Leisure/Recreation: Leisure / Recreation Do You Have Hobbies?: Yes Leisure and Hobbies: Likes to drive, likes music/concerts, doing her nails  Exercise/Diet: Exercise/Diet Do You Exercise?: Yes Have You Gained or Lost A Significant Amount of Weight in the Past Six Months?: No Do You Follow a Special Diet?: No Do You Have Any Trouble Sleeping?: No   CCA Employment/Education Employment/Work Situation: Employment / Work Situation Employment Situation: Employed (works part time) Where is Patient Currently Employed?: [employer] How Long has Patient Been Employed?: since May 2025 Are You Satisfied With Your Job?: Yes Do You Work More Than One Job?: No Work Stressors: panic attacks make her worried. Patient's Job has Been Impacted by Current Illness: Yes Describe how Patient's Job has Been Impacted: took a day off because of anxiety What is the Longest Time Patient has Held a Job?: 3 years. 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, pre-school teacher Has Patient ever Been in the Military?: No  Education: Education Is Patient Currently Attending School?: Yes (Working on [employer]) Did [name] From [employer]?: No Did You Product Manager?: No Did Designer, Television/film Set?: No Did You Have An Individualized Education Program (IIEP): Yes (In elementary school had an IEP due to anxiety and not wanting to go to school because of bullying.) Did You Have Any Difficulty At School?: No Patient's Education Has Been Impacted by Current Illness: No   CCA Family/Childhood History Family and Relationship History: Family history Are you sexually active?: Yes What is your sexual orientation?: heterosexual Does patient have children?: No  Childhood History:  Childhood History By whom was/is the patient raised?: Both parents Additional childhood history information: normal Description of patient's relationship with caregiver when they were a child: great Patient's description of current relationship with people who raised him/her: very close with both parients How were you disciplined when you got in trouble as a child/adolescent?: Talking to Does patient have siblings?: Yes Number of Siblings: 2 Description of patient's current relationship with siblings: Fairly close with both Did patient suffer any verbal/emotional/physical/sexual abuse as a child?: No Did patient suffer from severe childhood neglect?: No Has patient ever been  sexually abused/assaulted/raped as an adolescent or adult?: No Was the patient ever a victim of a crime or a disaster?: No Witnessed domestic violence?: No Has patient been affected by domestic violence as an adult?: No    CCA Substance Use Alcohol/Drug Use: Alcohol / Drug Use History of alcohol / drug use?: No history of alcohol / drug abuse        DSM5 Diagnoses: Patient Active Problem List   Diagnosis Date Noted   Generalized anxiety disorder 03/07/2024   Routine  Papanicolaou smear 01/12/2024   Weight gain 06/09/2023   Acne 06/09/2023   Abnormal facial hair 06/09/2023   Screening for thyroid  disorder 06/09/2023   Screening for diabetes mellitus 06/09/2023   Tired 06/09/2023   Anxiety 09/05/2022   Encounter for surveillance of contraceptive pills 06/10/2022   Acute bronchitis 03/04/2022   ADD (attention deficit disorder) 01/21/2013      03/07/2024    8:29 AM 04/16/2023   10:29 AM 09/04/2022    1:49 PM 02/24/2022   10:29 AM  GAD 7 : Generalized Anxiety Score  Nervous, Anxious, on Edge 3 3 2 1   Control/stop worrying 2 3 3 1   Worry too much - different things 3 3 3 1   Trouble relaxing 2 1 2 1   Restless 3 1 2  0  Easily annoyed or irritable 2 3 3 1   Afraid - awful might happen 3 0 0 0  Total GAD 7 Score 18 14 15 5   Anxiety Difficulty Very difficult Somewhat difficult      Flowsheet Row Counselor from 03/07/2024 in [facility] Total Score 0     Patient Centered Plan: Patient is on the following Treatment Plan(s):  Anxiety Active     Anxiety     LTG: Bryana will score less than 5 on the Generalized Anxiety Disorder 7 Scale (GAD-7)  (Initial)     Start:  03/07/24    Expected End:  03/06/25         STG: Mary will practice problem solving skills 3 times per week for the next 4 weeks.  (Initial)     Start:  03/07/24    Expected End:  03/06/25         STG: Mersades will reduce frequency of avoidant behaviors by 50% as evidenced by self-report in therapy sessions (Initial)     Start:  03/07/24    Expected End:  03/06/25         Encourage Mary to take psychotropic medication(s) as prescribed     Start:  03/07/24         Work with Mary to track symptoms, triggers, and/or skill use through a mood chart, diary card, or journal     Start:  03/07/24         Work with patient individually to identify the major components of a recent episode of anxiety: physical symptoms, major  thoughts and images, and major behaviors they experienced     Start:  03/07/24           Summary:  Shariah is a 23 year old woman with a history of Generalized anxiety disorder and ADHD, inattentive type.  She lives at home with her parents and works part time at [employer].  She has recently signed up for online GED classes.  She tried in person GED classes a couple of years ago, but anxiety and difficulty with job schedule made her drop out of that in person program.  Ahaana has a boyfriend of 4 years who is supportive and she has a good relationship with his family.  She stopped taking her medications about 1-2 months ago, especially her sertraline  and this caused an increase in her anxiety to the point where she stays at home for fear of panic attacks.  She tried to go to a [employer], but for over half the game she was experiencing panic attacks and was unable to sit in the stadium.  She avoids large stores such as [employer] due to the crowds and her fear that someone may experience a medical emergency while she is there or that she may have a panic attack in public.  Her family is supportive but wants her to move forward with being able to complete her education and choose a career path.  She comes for assistance with coping with anxiety.   Collaboration of Care: Primary care through [facility].  Visit notes shared with psychiatrist Dr Cornel Chester.  Patient/Guardian was advised Release of Information must be obtained prior to any record release in order to collaborate their care with an outside provider. Patient/Guardian was advised if they have not already done so to contact the registration department to sign all necessary forms in order for us  to release information regarding their care.   Consent: Patient/Guardian gives verbal consent for treatment and assignment of benefits for services provided during this visit. Patient/Guardian expressed understanding  and agreed to proceed.   Lauraine Ferrari, LCSW

## 2024-03-07 NOTE — Patient Instructions (Addendum)
 Return in 2 weeks, keep track daily of activities and trips outside the home.

## 2024-03-09 ENCOUNTER — Other Ambulatory Visit (HOSPITAL_COMMUNITY): Payer: Self-pay

## 2024-03-09 ENCOUNTER — Encounter (HOSPITAL_COMMUNITY): Payer: Self-pay

## 2024-03-11 ENCOUNTER — Emergency Department (HOSPITAL_COMMUNITY)

## 2024-03-11 ENCOUNTER — Encounter (HOSPITAL_COMMUNITY): Payer: Self-pay | Admitting: Emergency Medicine

## 2024-03-11 ENCOUNTER — Other Ambulatory Visit: Payer: Self-pay

## 2024-03-11 ENCOUNTER — Emergency Department (HOSPITAL_COMMUNITY)
Admission: EM | Admit: 2024-03-11 | Discharge: 2024-03-11 | Disposition: A | Discharge status: Home / Self Care | Attending: Emergency Medicine | Admitting: Emergency Medicine

## 2024-03-11 DIAGNOSIS — R0789 Other chest pain: Secondary | ICD-10-CM | POA: Insufficient documentation

## 2024-03-11 DIAGNOSIS — R079 Chest pain, unspecified: Secondary | ICD-10-CM | POA: Diagnosis not present

## 2024-03-11 LAB — BASIC METABOLIC PANEL WITH GFR
Anion gap: 13 (ref 5–15)
BUN: 12 mg/dL (ref 6–20)
CO2: 22 mmol/L (ref 22–32)
Calcium: 9.7 mg/dL (ref 8.9–10.3)
Chloride: 104 mmol/L (ref 98–111)
Creatinine, Ser: 0.84 mg/dL (ref 0.44–1.00)
GFR, Estimated: >60 mL/min (ref >60)
Glucose, Bld: 94 mg/dL (ref 70–99)
Potassium: 4.2 mmol/L (ref 3.5–5.1)
Sodium: 139 mmol/L (ref 135–145)

## 2024-03-11 LAB — HCG, SERUM, QUALITATIVE: Preg, Serum: NEGATIVE

## 2024-03-11 LAB — CBC
HCT: 44.8 % (ref 36.0–46.0)
Hemoglobin: 14.8 g/dL (ref 12.0–15.0)
MCH: 29 pg (ref 26.0–34.0)
MCHC: 33 g/dL (ref 30.0–36.0)
MCV: 87.8 fL (ref 80.0–100.0)
Platelets: 228 K/uL (ref 150–400)
RBC: 5.1 MIL/uL (ref 3.87–5.11)
RDW: 12.4 % (ref 11.5–15.5)
WBC: 8.6 K/uL (ref 4.0–10.5)
nRBC: 0 % (ref 0.0–0.2)

## 2024-03-11 LAB — D-DIMER, QUANTITATIVE: D-Dimer, Quant: <0.27 ug{FEU}/mL (ref 0.00–0.50)

## 2024-03-11 LAB — TROPONIN T, HIGH SENSITIVITY: Troponin T High Sensitivity: <15 ng/L (ref 0–19)

## 2024-03-11 NOTE — ED Provider Notes (Signed)
 [place] EMERGENCY DEPARTMENT AT [facility] Provider Note   CSN: [id number] Arrival date & time: 03/11/24  1210     Patient presents with: Anxiety   Mary Johnson is a 23 y.o. female with history of anxiety presents with complaints of intermittent chest pain with associated shortness of breath.  Has been having this over the past couple weeks since briefly discontinuing her anxiety medication.  She has resumed her medication however she still has periods of chest pain with shortness of breath.  States that her symptoms are associated when she has anxiety.  Today she had pain radiating down her left arm.  Her symptoms are not exertional or pleuritic.  She will intermittently have associated nausea.  Has no associated cough.  Has no personal cardiac history however she does have a strong family cardiac history.  No respiratory issues or history of PE.  She is on birth control.  No other PE risk factors.    Anxiety Associated symptoms include chest pain.      Past Medical History:  Diagnosis Date   Angio-edema    Anxiety    Concussion 09/2019   Screening examination for STD (sexually transmitted disease) 02/24/2022   Urticaria    Past Surgical History:  Procedure Laterality Date   TYMPANOSTOMY TUBE PLACEMENT       Prior to Admission medications  Medication Sig Start Date End Date Taking? Authorizing Provider  clonazePAM  (KLONOPIN ) 0.5 MG tablet Take 1 tablet (0.5 mg total) by mouth 2 (two) times daily as needed for anxiety. 03/02/24   Okey Barnie SAUNDERS, MD  diphenhydrAMINE  HCl (BENADRYL  ALLERGY  PO) Take by mouth.    [provider]  EPINEPHrine  (EPIPEN  2-PAK) 0.3 mg/0.3 mL IJ SOAJ injection Inject 0.3 mLs (0.3 mg total) into the muscle as needed for anaphylaxis. 11/18/19   Waddell Catholic M, DO  hydrOXYzine  (ATARAX ) 10 MG tablet Take 2 tablets (20 mg total) by mouth 2 (two) times daily. 10/12/23   Okey Barnie SAUNDERS, MD  ibuprofen  (ADVIL ) 200 MG tablet Take by  mouth. Takes 2 tabs as needed     [provider]  lisdexamfetamine  (VYVANSE ) 30 MG capsule Take 1 capsule (30 mg total) by mouth every morning. 03/02/24   Okey Barnie SAUNDERS, MD  lisdexamfetamine  (VYVANSE ) 30 MG capsule Take 1 capsule (30 mg total) by mouth every morning. 03/02/24   Okey Barnie SAUNDERS, MD  meclizine  (ANTIVERT ) 12.5 MG tablet Take 1 tablet (12.5 mg total) by mouth 3 (three) times daily as needed for dizziness. 02/23/24   Chandra Harlene LABOR, NP  Norethindrone-Ethinyl Estradiol -Fe Biphas (LO LOESTRIN FE ) 1 MG-10 MCG / 10 MCG tablet Take 1 tablet by mouth daily. 01/12/24   Signa Delon LABOR, NP  sertraline  (ZOLOFT ) 100 MG tablet Take 1 tablet (100 mg total) by mouth daily. 03/02/24   Okey Barnie SAUNDERS, MD    Allergies: Tamiflu  [oseltamivir  phosphate] and Alpha-gal    Review of Systems  Cardiovascular:  Positive for chest pain.    Updated Vital Signs BP 125/61   Pulse 92   Temp 98.6 F (37 C) (Oral)   Resp 16   Ht 5' 2 (1.575 m)   Wt 91.4 kg   LMP 03/03/2024   SpO2 97%   BMI 36.85 kg/m   Physical Exam Vitals and nursing note reviewed.  Constitutional:      General: She is not in acute distress.    Appearance: She is well-developed.  HENT:     Head: Normocephalic and  atraumatic.  Eyes:     Conjunctiva/sclera: Conjunctivae normal.  Cardiovascular:     Rate and Rhythm: Normal rate and regular rhythm.     Heart sounds: No murmur heard. Pulmonary:     Effort: Pulmonary effort is normal. No respiratory distress.     Breath sounds: Normal breath sounds.  Abdominal:     Palpations: Abdomen is soft.     Tenderness: There is no abdominal tenderness.  Musculoskeletal:        General: No swelling.     Cervical back: Neck supple.  Skin:    General: Skin is warm and dry.     Capillary Refill: Capillary refill takes less than 2 seconds.  Neurological:     Mental Status: She is alert.  Psychiatric:        Mood and Affect: Mood normal.     (all labs ordered are  listed, but only abnormal results are displayed) Labs Reviewed  BASIC METABOLIC PANEL WITH GFR  CBC  HCG, SERUM, QUALITATIVE  D-DIMER, QUANTITATIVE  TROPONIN T, HIGH SENSITIVITY    EKG: None  Radiology: [facility] Chest Port 1 View Result Date: 03/11/2024 CLINICAL DATA:  Chest pain EXAM: PORTABLE CHEST 1 VIEW COMPARISON:  None Available. FINDINGS: The heart size and mediastinal contours are within normal limits. Both lungs are clear. The visualized skeletal structures are unremarkable. IMPRESSION: No active disease. Electronically Signed   By: Lynwood Landy Raddle M.D.   On: 03/11/2024 14:48     Procedures   Medications Ordered in the ED - No data to display  Clinical Course as of 03/11/24 1532  Fri Mar 11, 2024  1406 Patient with history of anxiety evaluated for intermittent episodes of nonexertional or pleuritic chest pain, shortness of breath and nausea over the past couple weeks since briefly discontinuing her anxiety medication.  Upon arrival patient is hemodynamically stable.  Her exam is entirely benign.  Cardiac risk factors include family history.  PE risk factors include OCP use.  Accompanied by mother who is requesting cardiac workup.  Her EKG does appear changed from prior 10 years ago.  Will obtain routine cardiac workup with dimer. [JT]  1408 ED EKG Sinus rhythm with T wave abnormalities in III and aVF [JT]  1527 Workup is overall reassuring with negative troponin and dimer.  Overall etiology consistent with her anxiety.  Patient will be discharged home.  Encouraged to follow-up with her PCP for further evaluation. [JT]    Clinical Course User Index [JT] Donnajean Lynwood DEL, PA-C                                 Medical Decision Making  This patient presents to the ED with chief complaint(s) of Chest pain.  The complaint involves an extensive differential diagnosis and also carries with it a high risk of complications and morbidity.   Pertinent past medical history as listed in  HPI  The differential diagnosis includes  Based off exam and history do not suspect ACS, PE, aortic dissection, pneumonia, pneumothorax Additional history obtained: Additional history obtained from family Records reviewed Care Everywhere/External Records  Disposition:   Patient will be discharged home. The patient has been appropriately medically screened and/or stabilized in the ED. I have low suspicion for any other emergent medical condition which would require further screening, evaluation or treatment in the ED or require inpatient management. At time of discharge the patient is hemodynamically stable and in  no acute distress. I have discussed work-up results and diagnosis with patient and answered all questions. Patient is agreeable with discharge plan. We discussed strict return precautions for returning to the emergency department and they verbalized understanding.     Social Determinants of Health:   none  This note was dictated with voice recognition software.  Despite best efforts at proofreading, errors may have occurred which can change the documentation meaning.       Final diagnoses:  Atypical chest pain    ED Discharge Orders     None          Donnajean Lynwood VEAR DEVONNA 03/11/24 1532    Suzette Pac, MD 03/12/24 [phone]

## 2024-03-11 NOTE — Discharge Instructions (Signed)
 You were evaluated emergency room for chest pain and shortness of breath.  Your lab work and imaging did not show any significant abnormality.  Please follow with your primary care doctor for further evaluation.  If you experience any new or worsening symptoms you may return to the emergency room.

## 2024-03-11 NOTE — ED Notes (Signed)
 Denies any SOB or chest pressure at present

## 2024-03-11 NOTE — ED Triage Notes (Addendum)
 Pt complains of anxiety x 3 weeks. Feels chest pressure and nausea. Intermittent SOB with difficulty taking a deep breath. Started klonopin  10 days and has had some improvement. Pt took herself off of sertraline  and atarax  a few months ago and just started to take again 10 days ago.  Pt would like reassurance that all symptoms are anxiety and nothing more.  Denis si/hi.

## 2024-03-16 ENCOUNTER — Encounter: Payer: Self-pay | Admitting: Nurse Practitioner

## 2024-03-17 ENCOUNTER — Ambulatory Visit: Payer: Self-pay

## 2024-03-17 NOTE — Telephone Encounter (Signed)
 FYI Only or Action Required?: FYI only for provider: appointment scheduled on 03/18/24.  Patient was last seen in primary care on 04/16/2023 by Mauro Elveria BROCKS, NP.  Called Nurse Triage reporting Cough and Generalized Body Aches.  Symptoms began several days ago.  Interventions attempted: OTC medications: Delsym and Rest, hydration, or home remedies.  Symptoms are: gradually worsening.  Triage Disposition: See Physician Within 24 Hours  Patient/caregiver understands and will follow disposition?: Yes  Reason for Disposition  Earache  Answer Assessment - Initial Assessment Questions Spoke with patient's mother, Tillman, who states patient was around someone sick over the weekend and started to have symptoms a couple days ago. She has gotten worse with a productive cough with green sputum and earache today. She has sore throat, body aches, and reports chest pain when coughing. Patient's mother denies any physical symptoms of SOB, but states that patient reports she feels like it's hard to breathe. She has been unable to sleep and feeling very anxious as well. Concern for flu/bronchitis, but also mentions that patient is allergic to tamiflu . Office visit advised.   1. SYMPTOMS: What is your main symptom or concern? (e.g., cough, fever, shortness of breath, muscle aches)     Cough, body aches  2. ONSET: When did the symptoms start?      A few days ago  3. COUGH: Do you have a cough? If Yes, ask: How bad is the cough?       Yes, patient's mother states that patient is having a difficult time with cough  4. FEVER: Do you have a fever? If Yes, ask: What is your temperature, how was it measured, and when did it start?     No fever  5. BREATHING DIFFICULTY: Are you having any difficulty breathing? (e.g., normal; shortness of breath, wheezing, unable to speak)      No physical signs of SOB, but patient states she feels it's hard to breathe  6. BETTER-SAME-WORSE: Are you  getting better, staying the same or getting worse compared to yesterday?  If getting worse, ask, In what way?     Worse, cough has worsened and now productive with green sputum, also complains of ear ache today  7. OTHER SYMPTOMS: Do you have any other symptoms?  (e.g., chills, fatigue, headache, loss of smell or taste, muscle pain, sore throat)     Body aches, sore throat, chest pain with cough  8. INFLUENZA EXPOSURE: Was there any known exposure to influenza (flu) before the symptoms began?      Patient's mother states that she was around someone sick over the weekend  9. INFLUENZA SUSPECTED: Why do you think you have influenza? (e.g., positive flu self-test at home, symptoms after exposure).     Symptoms  10. INFLUENZA VACCINE: Have you had the flu vaccine? If Yes, ask: When did you last get it?       Unknown  11. HIGH RISK FOR COMPLICATIONS: Do you have any chronic medical problems? (e.g., asthma, heart or lung disease, obesity, weak immune system)       No  12. PREGNANCY: Is there any chance you are pregnant? When was your last menstrual period?       Unknown  13. O2 SATURATION MONITOR:  Do you use an oxygen saturation monitor (pulse oximeter) at home? If Yes, ask What is your reading (oxygen level) today? What is your usual oxygen saturation reading? (e.g., 95%)       Unknown  Protocols used: Influenza (Flu) Suspected-A-AH  Copied from CRM [id number]. Topic: Clinical - Red Word Triage >> Mar 17, 2024 12:40 PM Joesph NOVAK wrote: Red Word that prompted transfer to Nurse Triage: Productive cough (green). Body aches.

## 2024-03-18 ENCOUNTER — Other Ambulatory Visit (HOSPITAL_COMMUNITY): Payer: Self-pay

## 2024-03-18 ENCOUNTER — Ambulatory Visit

## 2024-03-18 ENCOUNTER — Other Ambulatory Visit: Payer: Self-pay | Admitting: Nurse Practitioner

## 2024-03-18 VITALS — BP 121/68 | HR 114 | Temp 97.5°F | Ht 62.0 in | Wt 198.2 lb

## 2024-03-18 DIAGNOSIS — J069 Acute upper respiratory infection, unspecified: Secondary | ICD-10-CM

## 2024-03-18 DIAGNOSIS — J029 Acute pharyngitis, unspecified: Secondary | ICD-10-CM | POA: Diagnosis not present

## 2024-03-18 DIAGNOSIS — R0602 Shortness of breath: Secondary | ICD-10-CM

## 2024-03-18 LAB — POCT RAPID STREP A (OFFICE): Rapid Strep A Screen: NEGATIVE

## 2024-03-18 MED ORDER — HYDROXYZINE HCL 25 MG PO TABS
25.0000 mg | ORAL_TABLET | Freq: Three times a day (TID) | ORAL | 0 refills | Status: DC | PRN
  Filled 2024-03-18: qty 90, 30d supply, fill #0

## 2024-03-18 MED ORDER — ALBUTEROL SULFATE HFA 108 (90 BASE) MCG/ACT IN AERS
2.0000 | INHALATION_SPRAY | Freq: Four times a day (QID) | RESPIRATORY_TRACT | 2 refills | Status: AC | PRN
  Filled 2024-03-18: qty 6.7, 25d supply, fill #0

## 2024-03-18 MED ORDER — AMOXICILLIN-POT CLAVULANATE 875-125 MG PO TABS
1.0000 | ORAL_TABLET | Freq: Two times a day (BID) | ORAL | 0 refills | Status: DC
  Filled 2024-03-18: qty 10, 5d supply, fill #0

## 2024-03-18 NOTE — Progress Notes (Signed)
 "  Acute Office Visit  Subjective:     Patient ID: Mary Johnson, female    DOB: June 29, 2000, 23 y.o.   MRN: [id number]  HPI Patient is in today for congestion, sore throat, productive cough, shortness of breath, and body aches for 4 days. Has been around people that have been sick.  Denies fever or chills.  Cough has been productive with brownish to green drainage. Appetite has been okay and patient has been able to drink fluids.  Has also had popping and pressure in the ears.  Has tried Flonase for symptoms but has had no relief. Was flu and covid negative at home.   Review of Systems  Constitutional:  Positive for fatigue. Negative for appetite change, chills and fever.  HENT:  Positive for congestion, ear pain, postnasal drip, rhinorrhea, sinus pressure and sore throat. Negative for sinus pain.   Respiratory:  Positive for shortness of breath. Negative for cough, chest tightness and wheezing.   Cardiovascular:  Negative for chest pain and palpitations.  Neurological:  Negative for headaches.       Objective:    Today's Vitals   03/18/24 1124  BP: 121/68  Pulse: (!) 114  Temp: (!) 97.5 F (36.4 C)  SpO2: 97%  Weight: 198 lb 4 oz (89.9 kg)  Height: 5' 2 (1.575 m)   Body mass index is 36.26 kg/m.   Physical Exam Vitals and nursing note reviewed.  Constitutional:      General: She is not in acute distress.    Appearance: Normal appearance. She is ill-appearing. She is not toxic-appearing.  HENT:     Right Ear: Tympanic membrane is retracted. Tympanic membrane is not erythematous.     Left Ear: Tympanic membrane is retracted. Tympanic membrane is not erythematous.     Nose: Congestion and rhinorrhea present.     Right Sinus: No maxillary sinus tenderness or frontal sinus tenderness.     Left Sinus: No maxillary sinus tenderness or frontal sinus tenderness.     Mouth/Throat:     Mouth: Mucous membranes are moist.     Pharynx: Posterior oropharyngeal erythema present. No  oropharyngeal exudate.  Cardiovascular:     Rate and Rhythm: Normal rate and regular rhythm.     Heart sounds: Normal heart sounds and S1 normal.  Pulmonary:     Effort: Pulmonary effort is normal. No respiratory distress.     Breath sounds: Normal breath sounds. No wheezing.  Lymphadenopathy:     Cervical: No cervical adenopathy.  Neurological:     Mental Status: She is alert.     Results for orders placed or performed in visit on 03/18/24  Rapid Strep A  Result Value Ref Range   Rapid Strep A Screen Negative Negative      Assessment & Plan:  1. Upper respiratory tract infection, unspecified type (Primary) -Educated patient that this is likely a viral respiratory tract infection.  Due to double worsening and bodyaches, would like to call in an antibiotic to put on hold at the pharmacy for over the weekend.  Educated patient to only pick this up if she has worsening of symptoms or experiences fever.  Patient verbalizes understanding.  Educated patient that this is self-limiting and may take a week to resolve.  -Educated patient to push fluids and increase food as tolerated. -Educated patient to take Tylenol or Motrin  for pain or fever. -Instructed patient on correct use of Flonase. - amoxicillin -clavulanate (AUGMENTIN ) 875-125 MG tablet; Take 1 tablet by mouth  2 (two) times daily.  Dispense: 10 tablet; Refill: 0  2. Sore throat -Rapid strep test was negative. - Rapid Strep A  3. Shortness of breath -Advised patient to use albuterol  as needed for shortness of breath or cough.  Educated patient that this is not something that is to be used daily and only used on a as needed basis.  Patient verbalizes understanding - albuterol  (VENTOLIN  HFA) 108 (90 Base) MCG/ACT inhaler; Inhale 2 puffs into the lungs every 6 (six) hours as needed for wheezing or shortness of breath.  Dispense: 6.7 g; Refill: 2   Return if symptoms worsen or fail to improve.  Damien KATHEE Pringle, FNP  "

## 2024-03-21 ENCOUNTER — Ambulatory Visit (HOSPITAL_COMMUNITY)

## 2024-03-21 NOTE — Progress Notes (Signed)
 Mary Johnson did not attend her scheduled appointment today.  She did not call to cancel or reschedule.

## 2024-04-11 ENCOUNTER — Encounter (HOSPITAL_COMMUNITY): Payer: Self-pay

## 2024-04-11 ENCOUNTER — Ambulatory Visit (HOSPITAL_COMMUNITY)

## 2024-04-11 DIAGNOSIS — F411 Generalized anxiety disorder: Secondary | ICD-10-CM

## 2024-04-11 DIAGNOSIS — F4001 Agoraphobia with panic disorder: Secondary | ICD-10-CM
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
## 2024-04-12 ENCOUNTER — Telehealth (HOSPITAL_COMMUNITY): Admitting: Psychiatry

## 2024-04-18 ENCOUNTER — Ambulatory Visit: Admitting: Nurse Practitioner

## 2024-04-18 VITALS — BP 117/80 | HR 67 | Temp 98.4°F | Ht 62.0 in | Wt 203.2 lb

## 2024-04-18 DIAGNOSIS — F411 Generalized anxiety disorder: Secondary | ICD-10-CM

## 2024-04-18 DIAGNOSIS — F900 Attention-deficit hyperactivity disorder, predominantly inattentive type: Secondary | ICD-10-CM | POA: Diagnosis not present

## 2024-04-18 NOTE — Progress Notes (Unsigned)
" ° °  Subjective:    Patient ID: Mary Johnson, female    DOB: [birth year], 22 y.o.   MRN: [id number]  HPI Patient is here for wanting to discuss anxiety medication. Patient is on medications to help with this issue, but at times feels like its not affective rather than other times  Relationship can cause anxiety issues at times   Review of Systems     Objective:   Physical Exam        Assessment & Plan:    "

## 2024-04-19 ENCOUNTER — Other Ambulatory Visit: Payer: Self-pay

## 2024-04-19 ENCOUNTER — Encounter: Payer: Self-pay | Admitting: Nurse Practitioner

## 2024-04-19 ENCOUNTER — Other Ambulatory Visit (HOSPITAL_COMMUNITY): Payer: Self-pay

## 2024-04-19 MED ORDER — LISDEXAMFETAMINE DIMESYLATE 40 MG PO CAPS
40.0000 mg | ORAL_CAPSULE | ORAL | 0 refills | Status: AC
  Filled 2024-04-19: qty 30, 30d supply, fill #0

## 2024-04-19 MED ORDER — SERTRALINE HCL 100 MG PO TABS
100.0000 mg | ORAL_TABLET | Freq: Every day | ORAL | 1 refills | Status: AC
  Filled 2024-04-19: qty 90, 90d supply, fill #0

## 2024-04-19 MED ORDER — HYDROXYZINE HCL 25 MG PO TABS
25.0000 mg | ORAL_TABLET | Freq: Three times a day (TID) | ORAL | 0 refills | Status: AC | PRN
  Filled 2024-04-19: qty 360, 60d supply, fill #0

## 2024-04-20 ENCOUNTER — Other Ambulatory Visit (HOSPITAL_COMMUNITY): Payer: Self-pay

## 2024-04-25 ENCOUNTER — Encounter (HOSPITAL_COMMUNITY): Payer: Self-pay

## 2024-04-25 ENCOUNTER — Ambulatory Visit (HOSPITAL_COMMUNITY)

## 2024-04-25 DIAGNOSIS — Z91199 Patient's noncompliance with other medical treatment and regimen due to unspecified reason: Secondary | ICD-10-CM
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
## 2024-07-21 ENCOUNTER — Ambulatory Visit: Admitting: Nurse Practitioner
